# Patient Record
Sex: Female | Born: 1945 | Race: White | Hispanic: No | Marital: Married | Smoking: Never smoker
Health system: Southern US, Community
[De-identification: ages and names within clinical notes are randomized; demographics above are authoritative.]

## PROBLEM LIST (undated history)

## (undated) DIAGNOSIS — Z9289 Personal history of other medical treatment: Secondary | ICD-10-CM

## (undated) DIAGNOSIS — M810 Age-related osteoporosis without current pathological fracture: Secondary | ICD-10-CM

## (undated) DIAGNOSIS — R011 Cardiac murmur, unspecified: Secondary | ICD-10-CM

## (undated) DIAGNOSIS — IMO0002 Reserved for concepts with insufficient information to code with codable children: Secondary | ICD-10-CM

## (undated) DIAGNOSIS — L570 Actinic keratosis: Secondary | ICD-10-CM

## (undated) DIAGNOSIS — K649 Unspecified hemorrhoids: Secondary | ICD-10-CM

## (undated) DIAGNOSIS — M8580 Other specified disorders of bone density and structure, unspecified site: Secondary | ICD-10-CM

## (undated) HISTORY — PX: SKIN CANCER EXCISION: SHX779

## (undated) HISTORY — DX: Actinic keratosis: L57.0

## (undated) HISTORY — DX: Unspecified hemorrhoids: K64.9

## (undated) HISTORY — DX: Cardiac murmur, unspecified: R01.1

## (undated) HISTORY — DX: Personal history of other medical treatment: Z92.89

## (undated) HISTORY — DX: Age-related osteoporosis without current pathological fracture: M81.0

## (undated) HISTORY — PX: COLONOSCOPY: SHX174

## (undated) HISTORY — DX: Reserved for concepts with insufficient information to code with codable children: IMO0002

## (undated) HISTORY — DX: Other specified disorders of bone density and structure, unspecified site: M85.80

---

## 1970-02-03 HISTORY — PX: LIPOMA EXCISION: SHX5283

## 1971-02-04 HISTORY — PX: HAND SURGERY: SHX662

## 2001-05-14 DIAGNOSIS — K649 Unspecified hemorrhoids: Secondary | ICD-10-CM

## 2001-05-14 HISTORY — DX: Unspecified hemorrhoids: K64.9

## 2004-09-04 ENCOUNTER — Ambulatory Visit: Payer: Self-pay

## 2005-10-14 ENCOUNTER — Ambulatory Visit: Payer: Self-pay

## 2006-11-02 ENCOUNTER — Ambulatory Visit: Payer: Self-pay

## 2006-11-03 ENCOUNTER — Ambulatory Visit: Payer: Self-pay

## 2007-11-16 ENCOUNTER — Ambulatory Visit: Payer: Self-pay

## 2008-11-16 ENCOUNTER — Ambulatory Visit: Payer: Self-pay

## 2009-11-20 ENCOUNTER — Ambulatory Visit: Payer: Self-pay

## 2010-02-03 DIAGNOSIS — M810 Age-related osteoporosis without current pathological fracture: Secondary | ICD-10-CM | POA: Insufficient documentation

## 2010-02-03 HISTORY — DX: Age-related osteoporosis without current pathological fracture: M81.0

## 2010-09-04 ENCOUNTER — Emergency Department: Payer: Self-pay

## 2011-01-02 ENCOUNTER — Ambulatory Visit: Payer: Self-pay

## 2012-01-12 ENCOUNTER — Ambulatory Visit: Payer: Self-pay | Admitting: Gastroenterology

## 2012-06-14 ENCOUNTER — Observation Stay: Payer: Self-pay | Admitting: Internal Medicine

## 2012-06-14 LAB — CBC WITH DIFFERENTIAL/PLATELET
Basophil %: 0.4 %
Eosinophil #: 0.1 10*3/uL (ref 0.0–0.7)
Eosinophil %: 1.5 %
HCT: 34.9 % — ABNORMAL LOW (ref 35.0–47.0)
HGB: 12.3 g/dL (ref 12.0–16.0)
Lymphocyte #: 1.4 10*3/uL (ref 1.0–3.6)
MCH: 30.4 pg (ref 26.0–34.0)
Monocyte #: 0.4 x10 3/mm (ref 0.2–0.9)
Neutrophil #: 3.7 10*3/uL (ref 1.4–6.5)
RDW: 12.9 % (ref 11.5–14.5)
WBC: 5.7 10*3/uL (ref 3.6–11.0)

## 2012-06-14 LAB — BASIC METABOLIC PANEL
Chloride: 105 mmol/L (ref 98–107)
Creatinine: 0.98 mg/dL (ref 0.60–1.30)
EGFR (Non-African Amer.): >60
Glucose: 113 mg/dL — ABNORMAL HIGH (ref 65–99)
Osmolality: 279 (ref 275–301)
Potassium: 4.3 mmol/L (ref 3.5–5.1)
Sodium: 138 mmol/L (ref 136–145)

## 2012-06-14 LAB — CK TOTAL AND CKMB (NOT AT ARMC)
CK, Total: 161 U/L (ref 21–215)
CK-MB: 1.5 ng/mL (ref 0.5–3.6)

## 2012-06-14 LAB — TROPONIN I: Troponin-I: <0.02 ng/mL

## 2012-06-15 DIAGNOSIS — I379 Nonrheumatic pulmonary valve disorder, unspecified: Secondary | ICD-10-CM

## 2012-06-15 LAB — CBC WITH DIFFERENTIAL/PLATELET
Basophil %: 0.4 %
Eosinophil #: 0.1 10*3/uL (ref 0.0–0.7)
Lymphocyte #: 1.5 10*3/uL (ref 1.0–3.6)
Lymphocyte %: 41.8 %
Monocyte %: 10.2 %
Neutrophil #: 1.5 10*3/uL (ref 1.4–6.5)
Neutrophil %: 44.2 %

## 2012-06-15 LAB — TROPONIN I: Troponin-I: <0.02 ng/mL

## 2012-06-15 LAB — LIPID PANEL
HDL Cholesterol: 61 mg/dL — ABNORMAL HIGH (ref 40–60)
Ldl Cholesterol, Calc: 108 mg/dL — ABNORMAL HIGH (ref 0–100)
Triglycerides: 50 mg/dL (ref 0–200)
VLDL Cholesterol, Calc: 10 mg/dL (ref 5–40)

## 2012-06-15 LAB — CK TOTAL AND CKMB (NOT AT ARMC): CK, Total: 124 U/L (ref 21–215)

## 2012-11-25 IMAGING — CT CT HEAD WITHOUT CONTRAST
2 series · 16 of 30 positions shown, 20 images · non-contrast
Comparison: none

REASON FOR EXAM: transient ams
COMMENTS:

[Series 2: without · axial · non-contrast · 0.42mm/px · z∈[+376,+506]mm · 13 of 32 slices shown, 17 images]
[im 3/32  brain]
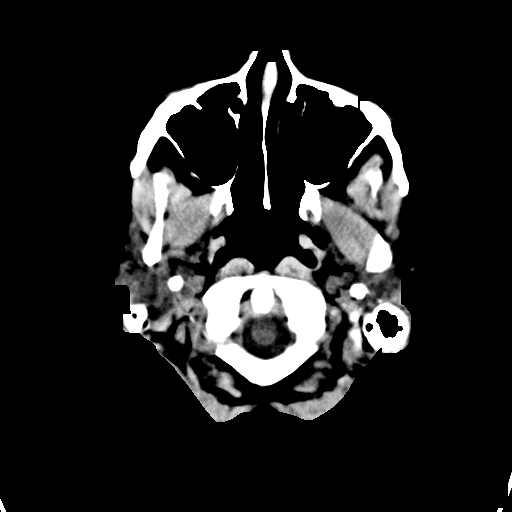
[im 3/32  bone]
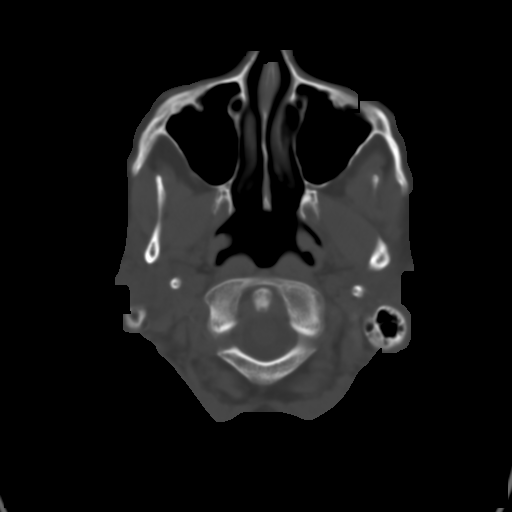
[im 5/32  brain]
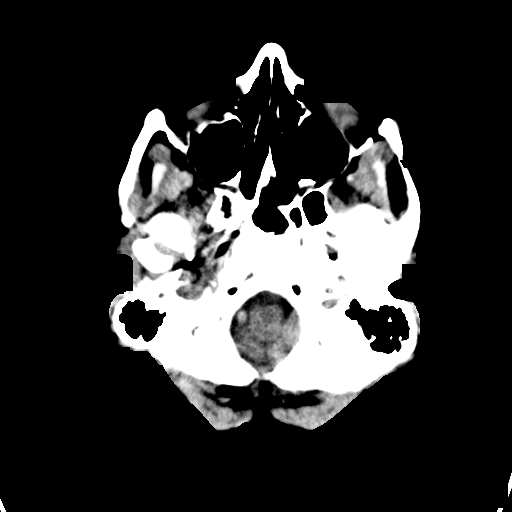
[im 7/32  brain]
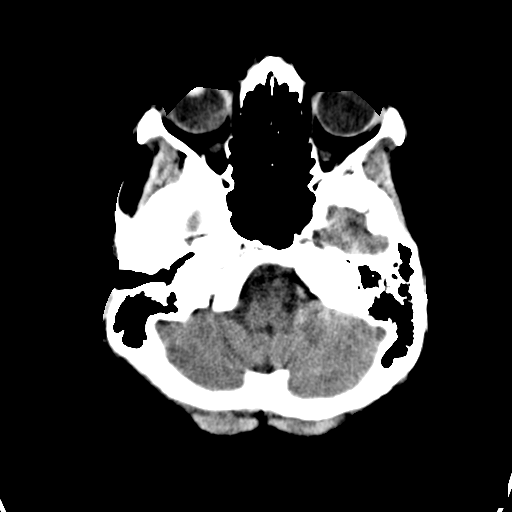
[im 9/32  brain]
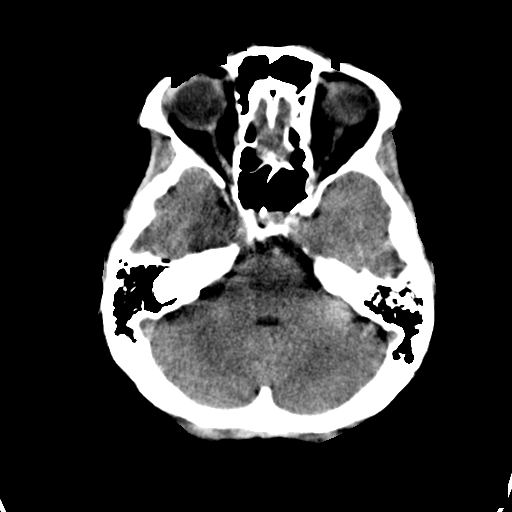
[im 12/32  brain]
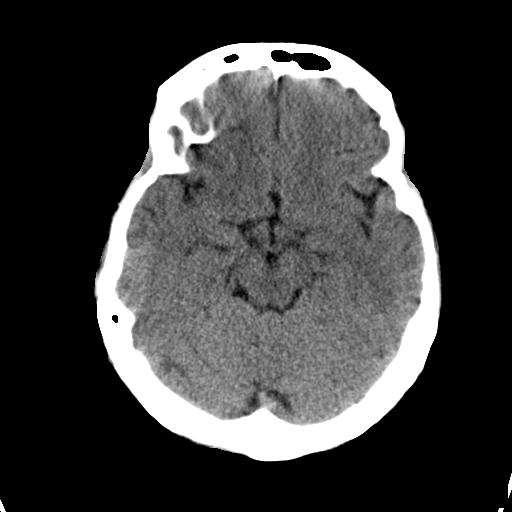
[im 12/32  bone]
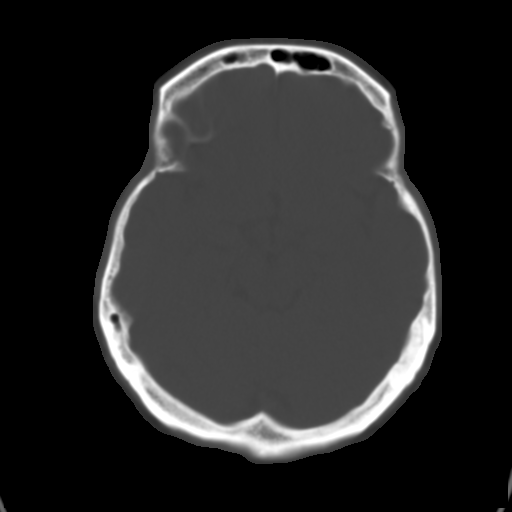
[im 14/32  brain]
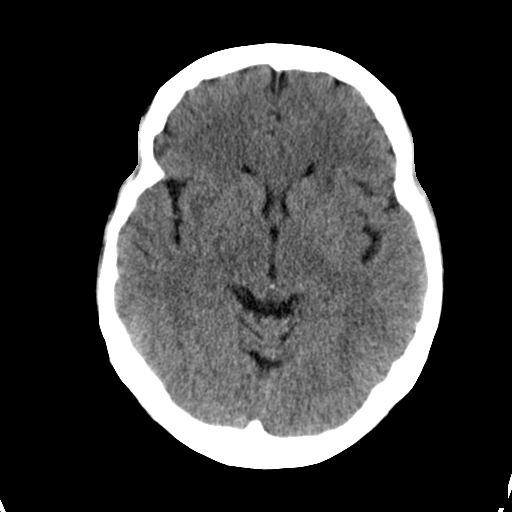
[im 16/32  brain]
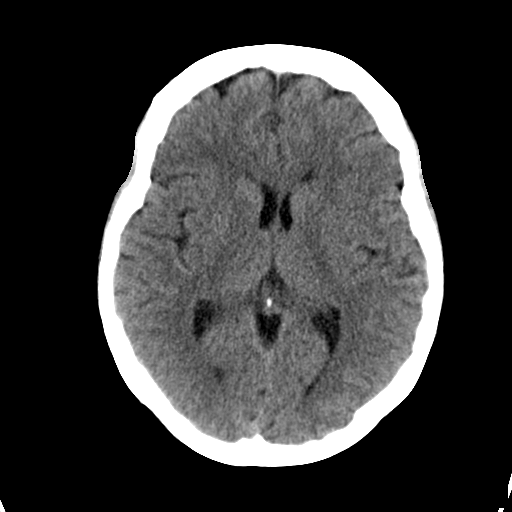
[im 18/32  brain]
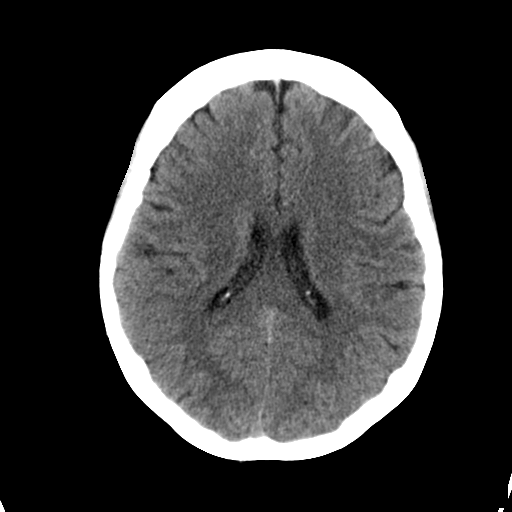
[im 20/32  brain]
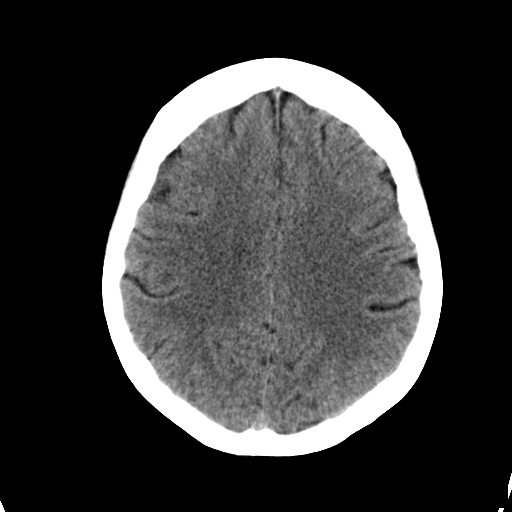
[im 20/32  bone]
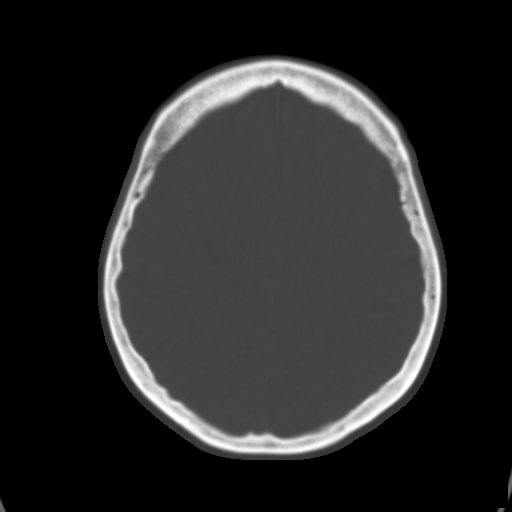
[im 23/32  brain]
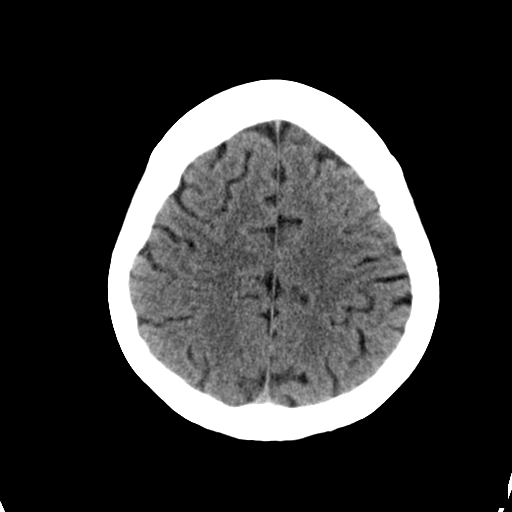
[im 25/32  brain]
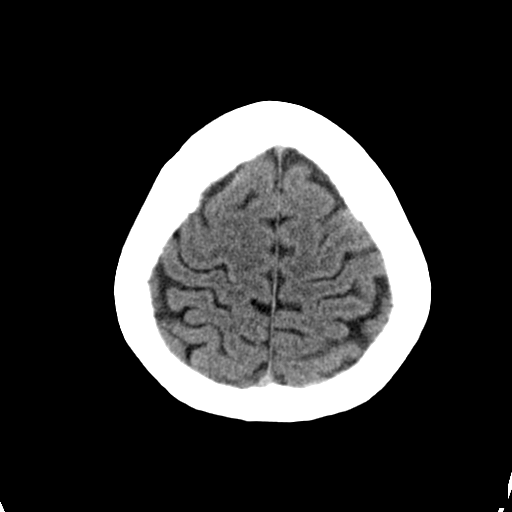
[im 27/32  brain]
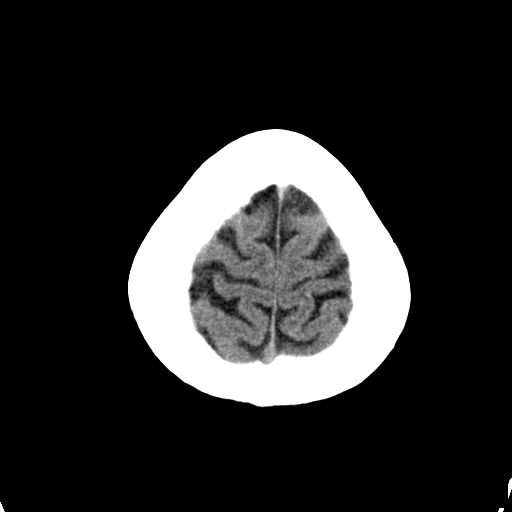
[im 29/32  brain]
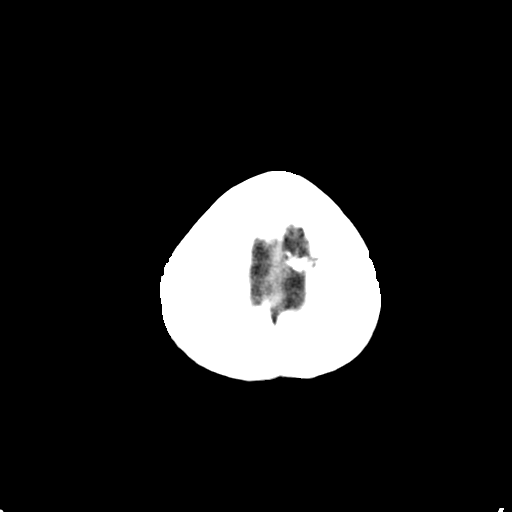
[im 29/32  bone]
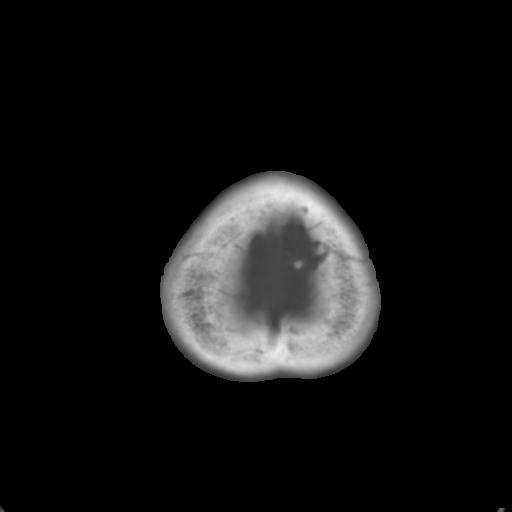

[Series 3: bone · axial · 0.42mm/px · z∈[+376,+420]mm · 3 of 32 slices shown]
[im 3/32  bone]
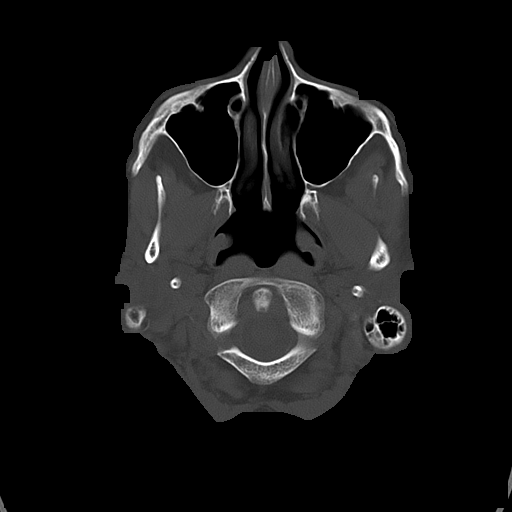
[im 7/32  bone]
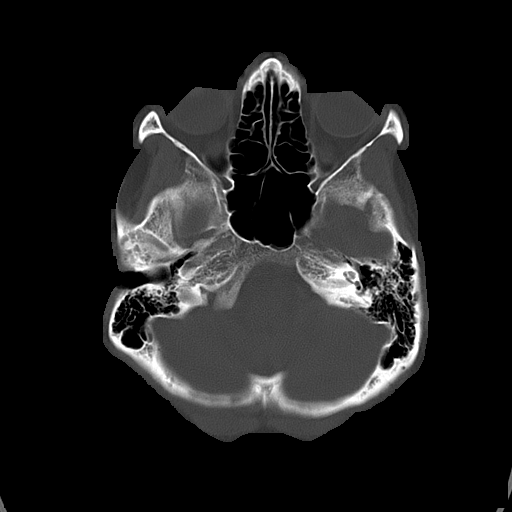
[im 12/32  bone]
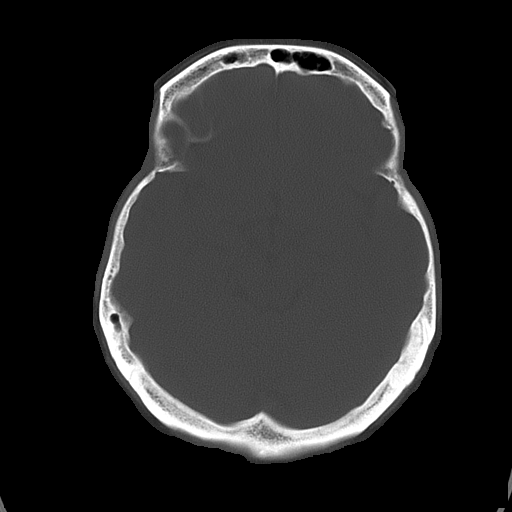

[16 of 30 positions shown; findings below may reference images not displayed]

PROCEDURE:     CT  - CT HEAD WITHOUT CONTRAST  - September 04, 2010  [DATE]

RESULT:     Noncontrast CT of the brain is performed in the standard
fashion. There is no previous exam for comparison.

The ventricles and sulci are normal. There is no hemorrhage. There is no
focal mass, mass-effect or midline shift. There is no evidence of edema or
territorial infarct. The bone windows demonstrate normal aeration of the
paranasal sinuses and mastoid air cells. There is no skull fracture
demonstrated.
IMPRESSION: 1. No acute intracranial abnormality.

## 2014-05-26 NOTE — H&P (Signed)
PATIENT NAME:  Maria Krueger, Maria Krueger MR#:  [id number] DATE OF BIRTH:  1945/12/09  DATE OF ADMISSION:  06/14/2012  PRIMARY CARE PHYSICIAN: Dr. Kary Kos.   CHIEF COMPLAINT: An abnormal sensation on the right jaw.   HISTORY OF PRESENT ILLNESS:  A 69 year old female with no past medical history who was sitting at her computer today when all of a sudden she felt a very unusual feeling on her right jaw and it went to her right arm. It lasted just maybe a minute or so. She said it feels like water pouring down her arm. No other neurological deficits. She got up, did some arm exercises, looked in the mirror, smiled, did some strength exercises and she had no neurological symptoms whatsoever. She said that she felt lightheaded but not dizzy. No chest pain or shortness of breath. Her thought process was very clear. She came to the ER because she was worried. In the ER, this happened just 1 more time.  It just lasted seconds.  It came and it went and this time only just an unusual feeling on the right arm, no tingling and she says it is not really numbness but it could be very mild numbness but she can feel her hand and there is no other abnormal sensation.    REVIEW OF SYSTEMS:   CONSTITUTIONAL: No fever, fatigue, weakness, weight loss, weight gain.  EYES: No blurry or double vision.   ENT:  No ear pain, hearing loss, seasonal allergies, epistaxis.   RESPIRATORY: No coughing, PND, dyspnea, painful respiration.  CARDIOVASCULAR: No chest pain, orthopnea, edema, arrhythmia, dyspnea, palpitations.   GASTROINTESTINAL:  No nausea, vomiting, diarrhea, abdominal pain, melena. Or ulcers.  GENITOURINARY:  No dysuria or hematuria.  ENDOCRINE: No polyuria, polydipsia, thyroid problems, heat or cold intolerance.  HEME AND LYMPH: No anemia, easy bruising or bleeding.  SKIN: No rash or lesion.   MUSCULOSKELETAL:  No pain in shoulders, knees, hips. No limited activity or arthritis. NEURO:  No history of CVA, TIA, seizures,  dementia.  PSYCH: No history of anxiety or depression.    PAST MEDICAL HISTORY:  None.   MEDICATIONS: Calcium 600 plus D 1 tablet t.i.d.    ALLERGIES: AMPICILLIN.   PAST SURGICAL HISTORY: None.   SOCIAL HISTORY: No tobacco, alcohol or drug use.   FAMILY HISTORY: Positive for lung cancer and her mother had a CVA and liver damage secondary to Vioxx.   PHYSICAL EXAMINATION: VITAL SIGNS: Temperature 98.1, pulse is 70, respirations 18, blood pressure 119/62 and 98% on room air.  GENERAL: The patient is alert, oriented, not in acute distress, well-developed, well-nourished.  HEENT: Head is atraumatic. Pupils are round. Sclerae anicteric. Mucous membranes are moist. Oropharynx is clear.  NECK: Supple without JVD, carotid bruit or enlarged thyroid. Full range of motion without pain.  CARDIOVASCULAR: Regular rate and rhythm. No murmurs, gallops or rubs. Cardiac palpation within normal limits. Femoral and pedal pulses are palpable. No peripheral edema.  LUNGS: Clear to auscultation without crackles, rales, rhonchi or wheezing. No dullness to percussion. Symmetrical expansion.  GASTROINTESTINAL: Bowel sounds positive. Nontender, nondistended. No  hepatosplenomegaly. No hernia.  No rebound, guarding or rigidity.  EXTREMITIES: No clubbing, cyanosis or edema.  NEUROLOGICAL EXAMINATION: Cranial nerves II through XII are intact. There are no focal deficits. DTRs are symmetric bilaterally. Motor strength is 4 out of 4. Cerebellar exam is completely negative.   LABORATORY, DIAGNOSTIC AND RADIOLOGIC DATA:  1.  White blood cells 5.7, hemoglobin 12.3, hematocrit 35, platelets 137. Sodium 138,  potassium 4.3, chloride 105, bicarb 29, BUN 21, creatinine 0.98, glucose 113, calcium is 9.3.  2.  CT of the head showed no acute intracranial hemorrhage or CVA.  3.  EKG normal sinus rhythm, no ST elevation or depression.   ASSESSMENT AND PLAN: This is a 69 year old female who presents with vague symptoms of right  jaw sensation and right arm sensory issues.  1.  Sensation issues. She says that she felt like something pouring down her right arm. She has no other neurological symptoms associated with it, no chest pain.  This is unlikely a transient ischemic attack, however, since it happened again in the ER, I think it is probably wise to make sure to completely rule out a transient ischemic attack or stroke. Her symptoms do not sound like a neurological event is happening. I will order an MRI. If the MRI is negative, the patient may be discharged without an echo or carotid Dopplers. If the MRI is positive, then we will need to order an echo and carotid Dopplers.  I will also monitor her on telemetry to rule out any acute coronary syndrome with troponins as the cause of these symptoms. We will do neuro checks q.4 hours and I have started aspirin, however, if the symptoms do not recur again, again the likelihood of a transient ischemic attack or neurological event is less likely so she would not need to be discharged with aspirin.  2.  Thrombocytopenia, mild. Will repeat a platelet count in the morning. The patient is ambulatory so I will not write for some Lovenox.  3.  The patient is full CODE STATUS.   TIME SPENT:  Approximately 40 minutes.     ____________________________ Donell Beers. Benjie Karvonen, MD spm:cs D: 06/14/2012 19:04:00 ET T: 06/14/2012 20:10:11 ET JOB#: [id number]  cc: Shalayah Beagley P. Benjie Karvonen, MD, <Dictator> Irven Easterly. Kary Kos, MD Donell Beers Loys Shugars MD ELECTRONICALLY SIGNED 06/14/2012 21:26

## 2014-05-26 NOTE — Discharge Summary (Signed)
PATIENT NAME:  Maria Krueger, OLSEN MR#:  [id number] DATE OF BIRTH:  25-Aug-1945  DATE OF ADMISSION:  06/14/2012 DATE OF DISCHARGE:  06/15/2012  DISCHARGE DIAGNOSIS:  Abnormal sensation in the right jaw/arm for a few seconds.  No transient ischemic attack/cerebrovascular accident and has been ruled out.  Negative MRI of the brain, carotid Dopplers.  This could be due to nerve pinching in the neck.  Negative orthostatic vitals.   SECONDARY DIAGNOSIS:  None.   CONSULTATIONS:  None.   PROCEDURES AND RADIOLOGY:   1.  CT scan of the head without contrast on May 12 showed no acute intracranial abnormalities.  2.  MRI of the brain without contrast showed intermediate hyperintensity on the FLAIR images, likely incidental area, could be due to sequela of migraine headache.  3.  Bilateral carotid Dopplers on May 13 showed no significant atherosclerotic disease.  No evidence of hemodynamically significant stenosis.  4.  A 2-D echocardiogram on May 13 showed normal LV systolic function with ejection fraction of 55% to 60%.  Mild to moderate pulmonary valve regurgitation.  Mild LVOT gradient which does not significantly worsen with Valsalva.   HISTORY AND SHORT HOSPITAL COURSE:  The patient is a 69 year old healthy female, was admitted for abnormal sensation in the right jaw/arm which was lasting seconds.  Please see Dr. Gardiner Coins dictated history and physical for further details.  There was some concern for TIA,  although she had a completely negative neurological work-up.  Her symptoms were only lasting a few seconds.  This was thought not to be due to TIA and possible nerve pinching as she has had a history of some nerve pinching in the lower back.  The patient was feeling much better and was discharged home in stable condition.   PHYSICAL EXAMINATION: VITAL SIGNS:  On the date of discharge her vital signs were as follows:  Temperature 98.8, heart rate 77 per minute, respirations 16 per minute, blood pressure 115/72  mmHg.  She was saturating 100% on room air.  Pertinent physical examination on the date of discharge:  CARDIOVASCULAR:  S1, S2 normal.  No murmurs, rubs or gallop.  LUNGS:  Clear to auscultation bilaterally.  No wheezing, rales, rhonchi or crepitation.  ABDOMEN:  Soft, benign.  NEUROLOGIC:  Nonfocal examination.  All other physical examination at baseline.   DISCHARGE MEDICATIONS: 1.  Calcium with vitamin D 1 tablet by mouth 3 times a day.  2.  Aspirin 81 mg by mouth daily.   DISCHARGE DIET:  Low fat, low cholesterol.   DISCHARGE ACTIVITY:  As tolerated.   DISCHARGE INSTRUCTIONS AND FOLLOW-UP:  The patient was instructed to follow up with her primary care physician, Dr. Jarome Lamas, in 1 to 2 weeks.   Total time spent on this patient was 45 minutes.    ____________________________ [name]. Manuella Ghazi, MD vss:ea D: 06/19/2012 09:32:14 ET T: 06/20/2012 01:39:55 ET JOB#: [id number]  cc: Elfa Wooton S. Manuella Ghazi, MD, <Dictator> Irven Easterly. Kary Kos, [place] MD ELECTRONICALLY SIGNED 06/21/2012 8:56

## 2015-02-04 DIAGNOSIS — C4491 Basal cell carcinoma of skin, unspecified: Secondary | ICD-10-CM

## 2015-02-04 HISTORY — DX: Basal cell carcinoma of skin, unspecified: C44.91

## 2015-03-23 ENCOUNTER — Encounter: Payer: Self-pay | Admitting: Family Medicine

## 2015-04-10 ENCOUNTER — Encounter: Payer: Self-pay | Admitting: Urology

## 2015-04-10 ENCOUNTER — Ambulatory Visit (INDEPENDENT_AMBULATORY_CARE_PROVIDER_SITE_OTHER): Payer: Medicare Other | Admitting: Urology

## 2015-04-10 VITALS — BP 124/84 | HR 99 | Ht 64.0 in | Wt 117.0 lb

## 2015-04-10 DIAGNOSIS — N362 Urethral caruncle: Secondary | ICD-10-CM | POA: Diagnosis not present

## 2015-04-10 DIAGNOSIS — R899 Unspecified abnormal finding in specimens from other organs, systems and tissues: Secondary | ICD-10-CM | POA: Diagnosis not present

## 2015-04-10 DIAGNOSIS — N952 Postmenopausal atrophic vaginitis: Secondary | ICD-10-CM

## 2015-04-10 NOTE — Progress Notes (Signed)
04/10/2015 1:42 PM   Raye Sorrow May 31, 1945 [id number]  Referring provider: No referring provider defined for this encounter.  Chief Complaint  Patient presents with  . Hematuria    New Patient    HPI: 70 yo F referred by Dr. Chrystine Oiler for further work up of light pinkish discharge on a pad  por the past year.  She reports that she received samples of a panty liner in the mail a little over a year ago and started using them. Since that time, she is noted a very faint pinkish discharge on her panty liner that turns brown when it dries. She is concerned that this is blood.    She brings a dry pad today as an exam.  This demonstrates scant brown tinged discharge.    She denies any stress incontience.  Occational rare urge incontinence.  No gross hematuria and very rare UTIs.  UA on 03/22/2014 a year ago did show evidence of UTI and ultimately grew Escherichia coli. She was treated adequately for this. Follow-up UAs have been negative for any blood including today.  Never smoker.    Rarely sexually active.  No symptoms consistent with pelvic organ prolapse.    She is menopausal.  She does still have her uterus. No obvious vaginal bleeding.  No vaginal symptoms .    PMH: Past Medical History  Diagnosis Date  . Squamous cell carcinoma ([place])   . Heart murmur     Surgical History: Past Surgical History  Procedure Laterality Date  . Skin cancer excision  2008    Home Medications:    Medication List       This list is accurate as of: 04/10/15 11:59 PM.  Always use your most recent med list.               CALCIUM 1200 PO  Take by mouth.     VITAMIN D-1000 MAX ST 1000 units tablet  Generic drug:  Cholecalciferol  Take by mouth.        Allergies:  Allergies  Allergen Reactions  . Ampicillin Rash    Family History: Family History  Problem Relation Age of Onset  . Bladder Cancer Neg Hx   . Kidney cancer Neg Hx   . Prostate cancer Neg Hx     Social  History:  reports that she has never smoked. She does not have any smokeless tobacco history on file. She reports that she does not drink alcohol or use illicit drugs.  ROS: UROLOGY Frequent Urination?: No Hard to postpone urination?: No Burning/pain with urination?: No Get up at night to urinate?: No Leakage of urine?: No Urine stream starts and stops?: No Trouble starting stream?: No Do you have to strain to urinate?: No Blood in urine?: No Urinary tract infection?: No Sexually transmitted disease?: No Injury to kidneys or bladder?: No Painful intercourse?: No Weak stream?: No Currently pregnant?: No Vaginal bleeding?: No Last menstrual period?: n  Gastrointestinal Nausea?: No Vomiting?: No Indigestion/heartburn?: No Diarrhea?: No Constipation?: No  Constitutional Fever: No Night sweats?: No Weight loss?: No Fatigue?: No  Skin Skin rash/lesions?: No Itching?: No  Eyes Blurred vision?: No Double vision?: No  Ears/Nose/Throat Sore throat?: No Sinus problems?: No  Hematologic/Lymphatic Swollen glands?: No Easy bruising?: Yes  Cardiovascular Leg swelling?: No Chest pain?: No  Respiratory Cough?: No Shortness of breath?: No  Endocrine Excessive thirst?: No  Musculoskeletal Back pain?: No Joint pain?: No  Neurological Headaches?: No Dizziness?: No  Psychologic Depression?: No  Anxiety?: No  Physical Exam: BP 124/84 mmHg  Pulse 99  Ht 5\' 4"  (1.626 m)  Wt 117 lb (53.071 kg)  BMI 20.07 kg/m2  Constitutional:  Alert and oriented, No acute distress. HEENT: [place] AT, moist mucus membranes.  Trachea midline, no masses. Cardiovascular: No clubbing, cyanosis, or edema. Respiratory: Normal respiratory effort, no increased work of breathing. GI: Abdomen is soft, nontender, nondistended, no abdominal masses GU: No CVA tenderness.  Pelvic exam:  Normal external genitalia. Urethral meatus with notable urethral caruncle at the 6:00 position, mildly  inflamed without active bleeding. Significant atrophic vaginitis with atrophic vaginal mucosa noted. No significant cystocele or rectocele with Valsalva. Skin: No rashes, bruises or suspicious lesions. Lymph: No cervical or inguinal adenopathy. Neurologic: Grossly intact, no focal deficits, moving all 4 extremities. Psychiatric: Normal mood and affect.  Laboratory Data: Lab Results  Component Value Date   WBC 3.5* 06/15/2012   HGB 12.0 06/15/2012   HCT 34.4* 06/15/2012   MCV 87 06/15/2012   PLT 121* 06/15/2012    Lab Results  Component Value Date   CREATININE 0.98 06/14/2012    Urine Culture, Routine - [facility] (03/22/2014 4:49 PM) Urine Culture, Routine - [facility] (03/22/2014 4:49 PM)  Component Value Ref Range  Urine Culture, Routine - [facility] Final report (A)   Result 1 - [facility] Escherichia coli (A)Comment: 50,000-100,000 colony forming units per mL   Antimicrobial Susceptibility - [facility] Comment Comment:  ** S = Susceptible; I = Intermediate; R = Resistant **  P = Positive; N = Negative MICS are expressed in micrograms per mL  Antibiotic RSLT#1RSLT#2RSLT#3RSLT#4 Amoxicillin/Clavulanic AcidS Ampicillin S Cefepime S CeftriaxoneS Cefuroxime S CephalothinS CiprofloxacinS ErtapenemS Gentamicin S Imipenem S Levofloxacin S Nitrofurantoin S Piperacillin S Tetracycline S Tobramycin S Trimethoprim/Sulfa R     Urinalysis Urinalysis w/Microscopic (03/22/2015 2:02 PM) Urinalysis w/Microscopic (03/22/2015 2:02 PM)  Component Value Ref Range  Color Yellow Yellow, Straw  Clarity Clear Clear  Specific Gravity 1.015 1.000 -  1.030  pH, Urine 7.0 5.0 - 8.0  Protein, Urinalysis Negative Negative, Trace mg/dL  Glucose, Urinalysis Negative Negative mg/dL  Ketones, Urinalysis Negative Negative mg/dL  Blood, Urinalysis Negative Negative  Nitrite, Urinalysis Negative Negative  Leukocyte Esterase, Urinalysis Trace (A) Negative  White Blood Cells, Urinalysis 0-3 None Seen, 0-3 /hpf  Red Blood Cells, Urinalysis None Seen None Seen, 0-3 /hpf  Bacteria, Urinalysis Few (A) None Seen /hpf  Squamous Epithelial Cells, Urinalysis Rare Rare, Few, None Seen /hpf   Results for orders placed or performed in visit on 04/10/15  Microscopic Examination  Result Value Ref Range   WBC, UA None seen 0 -  5 /hpf   RBC, UA None seen 0 -  2 /hpf   Epithelial Cells (non renal) None seen 0 - 10 /hpf   Bacteria, UA None seen None seen/Few  Urinalysis, Complete  Result Value Ref Range   Specific Gravity, UA 1.010 1.005 - 1.030   pH, UA 5.5 5.0 - 7.5   Color, UA Yellow Yellow   Appearance Ur Clear Clear   Leukocytes, UA Negative Negative   Protein, UA Negative Negative/Trace   Glucose, UA Negative Negative   Ketones, UA Negative Negative   RBC, UA Negative Negative   Bilirubin, UA Negative Negative   Urobilinogen, Ur 0.2 0.2 - 1.0 mg/dL   Nitrite, UA Negative Negative   Microscopic Examination See below:     Assessment & Plan:    1. Nonspecific abnormal finding in body substances Pink/brown tinged discharge on panty  liner. Review of urinalysis on several occasions revealed no evidence of microscopic hematuria. On exam today, she was found to have significant atrophic vaginitis (asymptomatic) as well as a small urethral caruncle.   Each of these may lead to some mild blood-tinged discharge which are likely the cause of her symptoms. No other concerning findings on exam today. I suspect the benign pathology.  Reviewed warning symptoms including gross hematuria or significant vaginal bleeding. - Urinalysis, Complete  2. Urethral  caruncle Asymptomatic, therefore no treatment recommended Did discuss the possibility of using local estrogen cream in future if becomes symptomatic  3. Atrophic vaginitis As above  Return if symptoms worsen or fail to improve.    Hollice Espy, MD  [facility] [address], [place], [place] [address] [phone]

## 2015-04-11 LAB — MICROSCOPIC EXAMINATION
Bacteria, UA: NONE SEEN
EPITHELIAL CELLS (NON RENAL): NONE SEEN /HPF (ref 0–10)
RBC MICROSCOPIC, UA: NONE SEEN /HPF (ref 0–?)
WBC UA: NONE SEEN /HPF (ref 0–?)

## 2015-04-11 LAB — URINALYSIS, COMPLETE
BILIRUBIN UA: NEGATIVE
GLUCOSE, UA: NEGATIVE
KETONES UA: NEGATIVE
LEUKOCYTES UA: NEGATIVE
Nitrite, UA: NEGATIVE
PROTEIN UA: NEGATIVE
RBC, UA: NEGATIVE
Specific Gravity, UA: 1.01 (ref 1.005–1.030)
Urobilinogen, Ur: 0.2 mg/dL (ref 0.2–1.0)
pH, UA: 5.5 (ref 5.0–7.5)

## 2016-01-22 ENCOUNTER — Other Ambulatory Visit: Payer: Self-pay | Admitting: Certified Nurse Midwife

## 2016-01-22 DIAGNOSIS — Z1231 Encounter for screening mammogram for malignant neoplasm of breast: Secondary | ICD-10-CM

## 2016-02-25 ENCOUNTER — Ambulatory Visit
Admission: RE | Admit: 2016-02-25 | Discharge: 2016-02-25 | Disposition: A | Discharge status: Home / Self Care | Payer: Medicare Other | Source: Ambulatory Visit | Attending: Certified Nurse Midwife | Admitting: Certified Nurse Midwife

## 2016-02-25 DIAGNOSIS — Z1231 Encounter for screening mammogram for malignant neoplasm of breast: Secondary | ICD-10-CM | POA: Insufficient documentation

## 2016-06-02 DIAGNOSIS — D239 Other benign neoplasm of skin, unspecified: Secondary | ICD-10-CM

## 2016-06-02 HISTORY — DX: Other benign neoplasm of skin, unspecified: D23.9

## 2016-11-27 ENCOUNTER — Other Ambulatory Visit: Payer: Self-pay | Admitting: Certified Nurse Midwife

## 2016-11-27 ENCOUNTER — Telehealth: Payer: Self-pay

## 2016-11-27 DIAGNOSIS — Z8739 Personal history of other diseases of the musculoskeletal system and connective tissue: Secondary | ICD-10-CM

## 2016-11-27 DIAGNOSIS — M85852 Other specified disorders of bone density and structure, left thigh: Secondary | ICD-10-CM

## 2016-11-27 NOTE — Telephone Encounter (Signed)
I put the referral in Nancy-it is due after 12/12/2016

## 2016-11-27 NOTE — Telephone Encounter (Signed)
I spoke to the patient. She would like to have the DEXA at [facility] again - info in [place]. I will contact [facility] to see if their referral process is the same.

## 2016-11-27 NOTE — Telephone Encounter (Signed)
Pt called triage line stating she knows this is her off year for her annual but it is time for her Bone density scan. She usually goes to [facility] in [place]. Please call her back when the referral is in. CB# [phone]

## 2016-11-28 NOTE — Telephone Encounter (Signed)
Confirmed with [facility] the fax# and that they will contact the patient directly.Referral faxed. Lmtrc.

## 2016-11-28 NOTE — Telephone Encounter (Signed)
Patient returned call and is aware [facility] will contact her directly to schedule an appointment. Patient was given my ext and is aware she is welcome to call back if she doesn't hear from [facility] in a week or so, that I am happy to f/u if needed.

## 2017-01-19 ENCOUNTER — Other Ambulatory Visit: Payer: Self-pay | Admitting: Family Medicine

## 2017-01-19 ENCOUNTER — Other Ambulatory Visit: Payer: Self-pay | Admitting: Certified Nurse Midwife

## 2017-01-19 DIAGNOSIS — Z1231 Encounter for screening mammogram for malignant neoplasm of breast: Secondary | ICD-10-CM

## 2017-03-03 ENCOUNTER — Ambulatory Visit
Admission: RE | Admit: 2017-03-03 | Discharge: 2017-03-03 | Disposition: A | Discharge status: Home / Self Care | Payer: Medicare HMO | Source: Ambulatory Visit | Attending: Family Medicine | Admitting: Family Medicine

## 2017-03-03 DIAGNOSIS — Z1231 Encounter for screening mammogram for malignant neoplasm of breast: Secondary | ICD-10-CM | POA: Diagnosis present

## 2017-09-27 NOTE — Progress Notes (Signed)
Gynecology Annual Exam  PCP: Maryland Pink, MD  Chief Complaint:  Chief Complaint  Patient presents with  . Gynecologic Exam    continuing to d/c; wt loss;     History of Present Illness:Maria Krueger presents today for her annual exam . She is a 72 year old postmenopausal Caucasian/White female , G 3 P 2 0 1 2 . She is having no significant GYN problems. Her menses are absent and she is postmenopausal. She does not have hot flashes and night sweats. She had seen Dr Erlene Quan in Urology in 2017 for some pinkish discharge she was having  And was diagnosed with a urethral caruncle. She reports that she is still having some brown spotting from this area intermittently. Has not resumed Vagifem after her 2017 visit.   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 Since her last annual GYN exam dated 09/26/2015, she has had no significant change in her health.  She is currently sexually active, and would like a RX of Vagifem in case she needs it. Has used this in the past for dyspareunia with relief. Her most recent pap smear was obtained 10/17/2013 and was normal.  Her most recent mammogram obtained on 1/ 03/03/2016 was normal. There is no family history of breast cancer. There is no family history of ovarian cancer. The patient does do monthly self breast exams.  She had a colonoscopy in 2013 that was normal. Her next colonoscopy is due in 10 years [name])  She had a recent DEXA scan obtained 12/17/2016 in that showed a significant improvement in density compared with her previous Dexa in 2016. Her T score of the spine increased from -1.8 to -1.5 and the T score of the hip increased from -2.2 to -1.8.  She has been on Boniva in the past for osteopenia, but since her 2016 DEXA has only been taking calcium supplement called Bone Up which contains 1000 mgm calcium, 100IU vitamin D3, and magnesium as well as   extra vitamin D2 liquid, vitamin K and strontium..  The patient does not smoke.  The patient does not drink alcohol.  The patient does not use illegal drugs.  The patient exercises regularly, 3-4 times a week and also does yoga. She has lost 2# in the last 2 years, probably from increased exercise and activity. The patient does get adequate calcium in her diet and with her supplements  She had a recent cholesterol screen in 2017 by her PCP DR Kary Kos that was normal.   Review of Systems: Review of Systems  Constitutional: Positive for weight loss (of 2 # in 2 years.). Negative for chills and fever.  HENT: Negative for congestion, sinus pain and sore throat.   Eyes: Negative for blurred vision and pain.  Respiratory: Negative for hemoptysis, shortness of breath and wheezing.   Cardiovascular: Negative for chest pain, palpitations and leg swelling.  Gastrointestinal: Negative for abdominal pain, blood in stool, diarrhea, heartburn, nausea and vomiting.  Genitourinary: Negative for dysuria, frequency, hematuria and urgency.       Spotting from caruncle  Musculoskeletal: Negative for back pain, joint pain and myalgias.  Skin: Negative for itching and rash.  Neurological: Negative for dizziness, tingling and headaches.  Endo/Heme/Allergies: Negative for environmental allergies and polydipsia. Does not bruise/bleed easily.       Negative for hirsutism   Psychiatric/Behavioral: Negative for depression. The patient is not nervous/anxious  and does not have insomnia.     Past Medical History:  Past Medical History:  Diagnosis Date  . Heart murmur   . Hemorrhoids 05/14/2001   internal non-bleeding (pre conoloscopy)  . History of mammogram 01/06/2013; 02/25/16   birads 2; neg  . Osteopenia 11/14; 11/16   left femoral neck and hip and spine; FRAX hip = 3.7% 11/16  . Osteoporosis 2012   lumbar spine  . Squamous cell carcinoma     Past Surgical History:  Past Surgical History:  Procedure  Laterality Date  . COLONOSCOPY  05/14/2001/ 2013   non bleeding internal hemorrhoids  . HAND SURGERY Left 1973   tendon tumor left pointer finger  . LIPOMA EXCISION  1972   waist  . SKIN CANCER EXCISION  2006; 2009   nose-basal cell ca; right ring finger - sq. cell ca    Family History:  Family History  Problem Relation Age of Onset  . Stroke Mother        d/t viox  . Diabetes Mother        type 2  . Heart attack Father   . Cancer Father 25       lung  . Bladder Cancer Neg Hx   . Kidney cancer Neg Hx   . Prostate cancer Neg Hx   . Breast cancer Neg Hx   . Ovarian cancer Neg Hx     Social History:  Social History   Socioeconomic History  . Marital status: Married    Spouse name: Not on file  . Number of children: 2  . Years of education: 54  . Highest education level: Not on file  Occupational History  . Occupation: RETIRED  Social Needs  . Financial resource strain: Not on file  . Food insecurity:    Worry: Not on file    Inability: Not on file  . Transportation needs:    Medical: Not on file    Non-medical: Not on file  Tobacco Use  . Smoking status: Never Smoker  . Smokeless tobacco: Never Used  Substance and Sexual Activity  . Alcohol use: No    Alcohol/week: 0.0 standard drinks  . Drug use: No  . Sexual activity: Yes    Partners: Male    Birth control/protection: Post-menopausal  Lifestyle  . Physical activity:    Days per week: Not on file    Minutes per session: Not on file  . Stress: Not on file  Relationships  . Social connections:    Talks on phone: Not on file    Gets together: Not on file    Attends religious service: Not on file    Active member of club or organization: Not on file    Attends meetings of clubs or organizations: Not on file    Relationship status: Not on file  . Intimate partner violence:    Fear of current or ex partner: Not on file    Emotionally abused: Not on file    Physically abused: Not on file    Forced  sexual activity: Not on file  Other Topics Concern  . Not on file  Social History Narrative  . Not on file    Allergies:  Allergies  Allergen Reactions  . Ampicillin Rash    Medications: Bone up-one capsule BID Liquid Vitamin D3 Strontium citrate 340 mag-take 2 daily Vitamin K2 Elderberry fruit 550 mgm daily    Physical Exam Vitals: BP (!) 110/50   Pulse 78  Ht 5' 4.5" (1.638 m)   Wt 113 lb 12 oz (51.6 kg)   BMI 19.22 kg/m   General: pleasant WF in NAD HEENT: normocephalic, anicteric Neck: no thyroid enlargement, no palpable nodules, no cervical lymphadenopathy  Pulmonary: No increased work of breathing, CTAB Cardiovascular: RRR, without murmur  Breast: Breast symmetrical, no tenderness, no palpable nodules or masses, no skin or nipple retraction present, no nipple discharge.  No axillary, infraclavicular or supraclavicular lymphadenopathy. Abdomen: Soft, non-tender, non-distended.  Umbilicus without lesions.  No hepatomegaly or masses palpable. No evidence of hernia. Genitourinary:  External: Atrophic changes, small caruncle posteriorly that appears red  Vagina: flattened rugae, no evidence of prolapse.    Cervix: Grossly normal in appearance, no bleeding, non-tender  Uterus: Anteverted, small, mobile, and non-tender  Adnexa: No adnexal masses, non-tender  Rectal: deferred  Lymphatic: no evidence of inguinal lymphadenopathy Extremities: no edema, erythema, or tenderness Neurologic: Grossly intact Psychiatric: mood appropriate, affect full     Assessment: 72 y.o. [id number] well woman postmenopausal exam Caruncle-symptomatic with spotting  Plan:   1) Breast cancer screening - recommend monthly self breast exam and annual screening mammograms. Mammogram due after 03/03/2018  2) Colon cancer screening-colonoscopy due in 2023.  3) Cervical cancer screening - Pap was done. ASCCP guidelines and rational discussed.  Patient opts for every 3-5 year  screening  interval  4) Bone density- recent DEXA showing increased bone density. Continue present plan of vitamin supplement and exercise. Repeat Dexa in 12/2018  5) Routine healthcare maintenance including cholesterol and diabetes screening managed by PCP   6) Discussed using topical estrogen cream at urethra for treatment of caruncle. Follow up in 2-3 months for persistent spotting of area-consider referral back to urology.  7) RX Vagifem if desires to use  8) RTO in 2 years and prn.  Dalia Heading, CNM

## 2017-09-28 ENCOUNTER — Other Ambulatory Visit (HOSPITAL_COMMUNITY)
Admission: RE | Admit: 2017-09-28 | Discharge: 2017-09-28 | Disposition: A | Discharge status: Home / Self Care | Payer: Medicare HMO | Source: Ambulatory Visit | Attending: Obstetrics and Gynecology | Admitting: Obstetrics and Gynecology

## 2017-09-28 ENCOUNTER — Encounter: Payer: Self-pay | Admitting: Certified Nurse Midwife

## 2017-09-28 ENCOUNTER — Ambulatory Visit (INDEPENDENT_AMBULATORY_CARE_PROVIDER_SITE_OTHER): Payer: Medicare HMO | Admitting: Certified Nurse Midwife

## 2017-09-28 VITALS — BP 110/50 | HR 78 | Ht 64.5 in | Wt 113.8 lb

## 2017-09-28 DIAGNOSIS — IMO0002 Reserved for concepts with insufficient information to code with codable children: Secondary | ICD-10-CM | POA: Insufficient documentation

## 2017-09-28 DIAGNOSIS — Z01419 Encounter for gynecological examination (general) (routine) without abnormal findings: Secondary | ICD-10-CM

## 2017-09-28 DIAGNOSIS — Z01411 Encounter for gynecological examination (general) (routine) with abnormal findings: Secondary | ICD-10-CM

## 2017-09-28 DIAGNOSIS — M8580 Other specified disorders of bone density and structure, unspecified site: Secondary | ICD-10-CM | POA: Insufficient documentation

## 2017-09-28 DIAGNOSIS — N362 Urethral caruncle: Secondary | ICD-10-CM | POA: Diagnosis not present

## 2017-09-28 DIAGNOSIS — M8589 Other specified disorders of bone density and structure, multiple sites: Secondary | ICD-10-CM

## 2017-09-28 DIAGNOSIS — Z124 Encounter for screening for malignant neoplasm of cervix: Secondary | ICD-10-CM | POA: Diagnosis present

## 2017-09-28 MED ORDER — ESTRADIOL 10 MCG VA TABS: 1.0000 | ORAL_TABLET | VAGINAL | 11 refills | Status: AC

## 2017-10-02 LAB — CYTOLOGY - PAP: DIAGNOSIS: NEGATIVE

## 2017-12-29 ENCOUNTER — Telehealth: Payer: Self-pay | Admitting: Certified Nurse Midwife

## 2017-12-29 NOTE — Telephone Encounter (Signed)
Patient was seen in August, had caruncle and given cream to try.  She was told to let [place] know after a few months if it helped, she states it is still present.  Please advise.

## 2017-12-30 NOTE — Telephone Encounter (Signed)
Pt's [facility]sb called after hour nurse stating that his wife was trying to see why the office called.  [phone]

## 2018-01-06 ENCOUNTER — Telehealth: Payer: Self-pay | Admitting: Certified Nurse Midwife

## 2018-01-06 NOTE — Telephone Encounter (Signed)
Returning patient call regarding caruncle. Estrogen cream topically initially worked to decrease spotting. Went to 2x/week and the symptoms returned-spotting and feeling irritated. Options explained including increasing cream to daily again or making appointment to see urologist back to discuss excision. Patient will make appointment to see urologist. Dalia Heading, CNM

## 2018-01-06 NOTE — Telephone Encounter (Signed)
Patient called.

## 2018-02-08 ENCOUNTER — Other Ambulatory Visit: Payer: Self-pay | Admitting: Family Medicine

## 2018-02-08 DIAGNOSIS — Z1231 Encounter for screening mammogram for malignant neoplasm of breast: Secondary | ICD-10-CM

## 2018-03-03 ENCOUNTER — Ambulatory Visit (INDEPENDENT_AMBULATORY_CARE_PROVIDER_SITE_OTHER): Payer: Medicare HMO | Admitting: Urology

## 2018-03-03 ENCOUNTER — Encounter: Payer: Self-pay | Admitting: Urology

## 2018-03-03 ENCOUNTER — Encounter

## 2018-03-03 VITALS — BP 131/80 | HR 83 | Ht 64.5 in | Wt 115.6 lb

## 2018-03-03 DIAGNOSIS — N362 Urethral caruncle: Secondary | ICD-10-CM | POA: Diagnosis not present

## 2018-03-04 ENCOUNTER — Ambulatory Visit
Admission: RE | Admit: 2018-03-04 | Discharge: 2018-03-04 | Disposition: A | Discharge status: Home / Self Care | Payer: Medicare HMO | Source: Ambulatory Visit | Attending: Family Medicine | Admitting: Family Medicine

## 2018-03-04 DIAGNOSIS — Z1231 Encounter for screening mammogram for malignant neoplasm of breast: Secondary | ICD-10-CM

## 2018-03-04 NOTE — Progress Notes (Signed)
03/03/2018 4:04 PM   Maria Krueger 1945-04-13 [id number]  Referring provider: Maryland Pink, MD [address] [facility] [place], [place] [address]  Chief Complaint  Patient presents with  . New Patient (Initial Visit)    HPI: 73 year old female previously seen in 2017 for urethral caruncle who returns today for follow-up.  At that time, she was seen and evaluated for slight pink tinge on her panty liner concerning for blood.  On exam, she is noted to have a small urethral carbuncle presumably the source of her bloody discharge.  At the time, she was asymptomatic thus no further intervention was deemed necessary.  More recently, she was seen and evaluated by her GYN provider, nurse midwife Dalia Heading.  On examination, the urethral carbuncle was again noted.  She was advised to resume topical estrogen cream.  She noticed decreased spotting on her pad after completing a 2-week course but when she cut back to 2 times a week, she noticed recurrence of this.  She denies any urethral pain, burning with urination, or mass.  Other than seeing the spotting, she is otherwise asymptomatic.  Review of multiple previous urinalysis over the past several years revealed no microscopic hematuria.  UA today is also negative.   PMH: Past Medical History:  Diagnosis Date  . Heart murmur   . Hemorrhoids 05/14/2001   internal non-bleeding (pre conoloscopy)  . History of mammogram 01/06/2013; 02/25/16   birads 2; neg  . Osteopenia 11/14; 11/16   left femoral neck and hip and spine; FRAX hip = 3.7% 11/16  . Osteoporosis 2012   lumbar spine  . Squamous cell carcinoma     Surgical History: Past Surgical History:  Procedure Laterality Date  . COLONOSCOPY  05/14/2001/ 2013   non bleeding internal hemorrhoids  . HAND SURGERY Left 1973   tendon tumor left pointer finger  . LIPOMA EXCISION  1972   waist  . SKIN CANCER EXCISION  2006; 2009   nose-basal cell ca; right ring finger -  sq. cell ca    Home Medications:  Allergies as of 03/03/2018      Reactions   Ampicillin Rash      Medication List       Accurate as of March 03, 2018 11:59 PM. Always use your most recent med list.        Estradiol 10 MCG Tabs vaginal tablet Place 1 tablet (10 mcg total) vaginally 2 (two) times a week.   OVER THE COUNTER MEDICATION Take 1 tablet by mouth daily.   OVER THE COUNTER MEDICATION Take 2 capsules by mouth daily.   OVER THE COUNTER MEDICATION Take 1 capsule by mouth daily.   OVER THE COUNTER MEDICATION Take 1 capsule by mouth 2 (two) times daily.   Vitamin D3 5000 UNIT/ML Liqd Take 25,000 Units by mouth daily.       Allergies:  Allergies  Allergen Reactions  . Ampicillin Rash    Family History: Family History  Problem Relation Age of Onset  . Stroke Mother        d/t viox  . Diabetes Mother        type 2  . Heart attack Father   . Cancer Father 66       lung  . Bladder Cancer Neg Hx   . Kidney cancer Neg Hx   . Prostate cancer Neg Hx   . Breast cancer Neg Hx   . Ovarian cancer Neg Hx     Social  History:  reports that she has never smoked. She has never used smokeless tobacco. She reports that she does not drink alcohol or use drugs.  ROS: UROLOGY Frequent Urination?: No Hard to postpone urination?: No Burning/pain with urination?: No Get up at night to urinate?: No Leakage of urine?: No Urine stream starts and stops?: No Trouble starting stream?: No Do you have to strain to urinate?: No Blood in urine?: No Urinary tract infection?: No Sexually transmitted disease?: No Injury to kidneys or bladder?: No Painful intercourse?: No Weak stream?: No Currently pregnant?: No Vaginal bleeding?: No Last menstrual period?: n  Gastrointestinal Nausea?: No Vomiting?: No Indigestion/heartburn?: No Diarrhea?: No Constipation?: No  Constitutional Fever: No Night sweats?: No Weight loss?: No Fatigue?: No  Skin Skin  rash/lesions?: No Itching?: No  Eyes Blurred vision?: No Double vision?: No  Ears/Nose/Throat Sore throat?: No Sinus problems?: No  Hematologic/Lymphatic Swollen glands?: No Easy bruising?: No  Cardiovascular Leg swelling?: No Chest pain?: No  Respiratory Cough?: No Shortness of breath?: No  Endocrine Excessive thirst?: No  Musculoskeletal Back pain?: No Joint pain?: No  Neurological Headaches?: No Dizziness?: No  Psychologic Depression?: No Anxiety?: No  Physical Exam: BP 131/80 (BP Location: Left Arm, Patient Position: Sitting, Cuff Size: Normal)   Pulse 83   Ht 5' 4.5" (1.638 m)   Wt 115 lb 9.6 oz (52.4 kg)   BMI 19.54 kg/m   Constitutional:  Alert and oriented, No acute distress. HEENT: [place] AT, moist mucus membranes.  Trachea midline, no masses. Cardiovascular: No clubbing, cyanosis, or edema. Respiratory: Normal respiratory effort, no increased work of breathing. GI: Abdomen is soft, nontender, nondistended, no abdominal masses GU: Pelvic exam chaperoned by Fonnie Jarvis, CMA.  Normal external genitalia with atrophic vaginal mucosa appreciated.  Normal urethral meatus with a small 2 to 3 mm urethral caruncle at the 6 o'clock position appreciated, does not appear particularly inflamed with no active bleeding noted. Skin: No rashes, bruises or suspicious lesions. Neurologic: Grossly intact, no focal deficits, moving all 4 extremities. Psychiatric: Normal mood and affect.  Laboratory Data: Lab Results  Component Value Date   WBC 3.5 (L) 06/15/2012   HGB 12.0 06/15/2012   HCT 34.4 (L) 06/15/2012   MCV 87 06/15/2012   PLT 121 (L) 06/15/2012    Lab Results  Component Value Date   CREATININE 0.98 06/14/2012    Urinalysis UA from 10/01/2017, 06/2015, 03/2015 all reviewed, no evidence of microscopic blood.  Assessment & Plan:    1. Urethral caruncle Relatively asymptomatic quite small urethral carbuncle which appears stable from several years  ago  Discussed the risk and benefits of topical estrogen cream at length including the risk of systemic absorption which is relatively low, no contraindications.  On her bother, she will either continue this medication or discontinue, may resume as needed for symptomatic relief.  We reviewed her anatomy and how and where to apply the medication.  Given that she is relatively asymptomatic and very small size, surgical excision does not seem necessary or appropriate.  To let us know if she becomes more symptomatic and would like to consider surgical excision in the future.  Patient was reassured.  F/u prn  Hollice Espy, MD  [facility] [address] [place], [place] [address] [phone]

## 2018-03-09 ENCOUNTER — Ambulatory Visit: Payer: Medicare Other | Admitting: Urology

## 2019-02-10 ENCOUNTER — Other Ambulatory Visit: Payer: Self-pay | Admitting: Certified Nurse Midwife

## 2019-02-10 ENCOUNTER — Telehealth: Payer: Self-pay | Admitting: Certified Nurse Midwife

## 2019-02-10 DIAGNOSIS — Z1231 Encounter for screening mammogram for malignant neoplasm of breast: Secondary | ICD-10-CM

## 2019-02-10 NOTE — Telephone Encounter (Signed)
Patient is calling to see if if Jaclyn Shaggy will put in an order for her to schedule for her mammogram. Patient isn't due for annual in til after 09/29/19 due to insurance please advise

## 2019-02-10 NOTE — Telephone Encounter (Signed)
Patient is aware 

## 2019-02-10 NOTE — Telephone Encounter (Signed)
Hi Maria Krueger, please let her know that I placed the order. She can call to schedule . It is not due till after 30 January

## 2019-03-08 ENCOUNTER — Ambulatory Visit
Admission: RE | Admit: 2019-03-08 | Discharge: 2019-03-08 | Disposition: A | Discharge status: Home / Self Care | Payer: Medicare HMO | Source: Ambulatory Visit | Attending: Certified Nurse Midwife | Admitting: Certified Nurse Midwife

## 2019-03-08 DIAGNOSIS — Z1231 Encounter for screening mammogram for malignant neoplasm of breast: Secondary | ICD-10-CM | POA: Diagnosis present

## 2019-06-06 ENCOUNTER — Ambulatory Visit: Payer: Medicare HMO | Admitting: Dermatology

## 2019-08-31 ENCOUNTER — Encounter: Payer: Self-pay | Admitting: Dermatology

## 2019-08-31 ENCOUNTER — Ambulatory Visit: Payer: Medicare HMO | Admitting: Dermatology

## 2019-08-31 ENCOUNTER — Other Ambulatory Visit: Payer: Self-pay

## 2019-08-31 DIAGNOSIS — Z1283 Encounter for screening for malignant neoplasm of skin: Secondary | ICD-10-CM | POA: Diagnosis not present

## 2019-08-31 DIAGNOSIS — D229 Melanocytic nevi, unspecified: Secondary | ICD-10-CM

## 2019-08-31 DIAGNOSIS — L820 Inflamed seborrheic keratosis: Secondary | ICD-10-CM

## 2019-08-31 DIAGNOSIS — D224 Melanocytic nevi of scalp and neck: Secondary | ICD-10-CM | POA: Diagnosis not present

## 2019-08-31 DIAGNOSIS — L821 Other seborrheic keratosis: Secondary | ICD-10-CM

## 2019-08-31 DIAGNOSIS — D489 Neoplasm of uncertain behavior, unspecified: Secondary | ICD-10-CM

## 2019-08-31 DIAGNOSIS — D1801 Hemangioma of skin and subcutaneous tissue: Secondary | ICD-10-CM

## 2019-08-31 DIAGNOSIS — D485 Neoplasm of uncertain behavior of skin: Secondary | ICD-10-CM

## 2019-08-31 DIAGNOSIS — L814 Other melanin hyperpigmentation: Secondary | ICD-10-CM

## 2019-08-31 DIAGNOSIS — L578 Other skin changes due to chronic exposure to nonionizing radiation: Secondary | ICD-10-CM

## 2019-08-31 NOTE — Patient Instructions (Signed)
Recommend daily broad spectrum sunscreen SPF 30+ to sun-exposed areas, reapply every 2 hours as needed. Call for new or changing lesions.  

## 2019-08-31 NOTE — Progress Notes (Signed)
Follow-Up Visit   Subjective  Maria Krueger is a 74 y.o. female who presents for the following: TBSE. The patient presents for Total-Body Skin Exam (TBSE) for skin cancer screening and mole check. Patient presents today for annual TBSE, has an area of concern on her left forehead that she would like to have removed. Patient has a h/o BCC and SCC from years ago  The following portions of the chart were reviewed this encounter and updated as appropriate:  Tobacco  Allergies  Meds  Problems  Med Hx  Surg Hx  Fam Hx     Review of Systems:  No other skin or systemic complaints except as noted in HPI or Assessment and Plan.  Objective  Well appearing patient in no apparent distress; mood and affect are within normal limits.  A full examination was performed including scalp, head, eyes, ears, nose, lips, neck, chest, axillae, abdomen, back, buttocks, bilateral upper extremities, bilateral lower extremities, hands, feet, fingers, toes, fingernails, and toenails. All findings within normal limits unless otherwise noted below.  Objective  Right Forehead x 2 (2): Red papules.   Objective  Left Thigh - Anterior Superior: 0.4 cm irregular brown macule  Objective  Left Thigh - Anterior Inferior: 0.4 cm irregular brown macule   Assessment & Plan  Nevus Neck - Anterior Nevus vs sebaceous hyperplasia Benign-appearing.  Observation.  Call clinic for new or changing moles.  Recommend daily use of broad spectrum spf 30+ sunscreen to sun-exposed areas.   Inflamed seborrheic keratosis (3) Right Forearm; Right  Mandible x 2 (2) Cryotherapy today Prior to procedure, discussed risks of blister formation, small wound, skin dyspigmentation, or rare scar following cryotherapy.    Destruction of lesion - Right  Mandible x 2, Right Forearm Complexity: simple   Destruction method: cryotherapy   Informed consent: discussed and consent obtained   Timeout:  patient name, date of birth,  surgical site, and procedure verified Lesion destroyed using liquid nitrogen: Yes   Region frozen until ice ball extended beyond lesion: Yes   Outcome: patient tolerated procedure well with no complications   Post-procedure details: wound care instructions given    Hemangioma of skin (2) Right Forehead x 2 Cosmetic removal with ED ($60 for first and $15 for each additional equals $75 out-of-pocket) Destruction of lesion - Right Forehead x 2  Destruction method: electrodesiccation and curettage   Patient was prepped and draped in usual sterile fashion: alocohol. Anesthesia: the lesion was anesthetized in a standard fashion   Anesthetic:  1% lidocaine w/ epinephrine 1-100,000 buffered w/ 8.4% NaHCO3 Hemostasis achieved with:  pressure Post-procedure details: wound care instructions given   Post-procedure details comment:  Bandage applied with mupirocin ointment  Neoplasm of uncertain behavior (2) Left Thigh - Anterior Superior  Skin / nail biopsy  Specimen 1 - Surgical pathology Differential Diagnosis: nevus r/o dysplasia Check Margins: No 0.4 cm irregular brown macule  Left Thigh - Anterior Inferior  Skin / nail biopsy  Specimen 2 - Surgical pathology Differential Diagnosis: nevus r/o dysplasia Check Margins: No 0.4 cm irregular brown macule   Lentigines - Scattered tan macules - Discussed due to sun exposure - Benign, observe - Call for any changes  Seborrheic Keratoses - Stuck-on, waxy, tan-brown papules and plaques  - Discussed benign etiology and prognosis. - Observe - Call for any changes  Melanocytic Nevi - Tan-brown and/or pink-flesh-colored symmetric macules and papules - Benign appearing on exam today - Observation - Call clinic for new or changing  moles - Recommend daily use of broad spectrum spf 30+ sunscreen to sun-exposed areas.   Hemangiomas - Red papules - Discussed benign nature - Observe - Call for any changes  Actinic Damage - diffuse  scaly erythematous macules with underlying dyspigmentation - Recommend daily broad spectrum sunscreen SPF 30+ to sun-exposed areas, reapply every 2 hours as needed.  - Call for new or changing lesions.  Skin cancer screening performed today. History of Skin Cancer   Clear. Observe for recurrence. Call clinic for new or changing lesions.  Recommend regular skin exams, daily broad-spectrum spf 30+ sunscreen use, and photoprotection.      Return in about 1 year (around 08/30/2020) for TBSE.  I, Donzetta Kohut, CMA, am acting as scribe for [name], MD . Documentation: I have reviewed the above documentation for accuracy and completeness, and I agree with the above.  [name], MD

## 2019-09-04 ENCOUNTER — Encounter: Payer: Self-pay | Admitting: Dermatology

## 2019-09-12 ENCOUNTER — Telehealth: Payer: Self-pay

## 2019-09-12 NOTE — Telephone Encounter (Signed)
-----   Message from Ralene Bathe, MD sent at 09/08/2019  6:22 PM EDT ----- 1. 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 Skin , left thigh - anterior inferior DYSPLASTIC NEVUS WITH MODERATE TO SEVERE ATYPIA, LATERAL MARGIN INVOLVED, SEE DESCRIPTION  1- dysplastic Moderate Recheck next visit 2- Moderate to Severe Dysplastic Schedule for shave removal

## 2019-09-12 NOTE — Telephone Encounter (Signed)
Patient informed of pathology results and appointment scheduled.  °

## 2019-10-03 ENCOUNTER — Other Ambulatory Visit: Payer: Self-pay

## 2019-10-03 ENCOUNTER — Encounter: Payer: Self-pay | Admitting: Obstetrics and Gynecology

## 2019-10-03 ENCOUNTER — Ambulatory Visit (INDEPENDENT_AMBULATORY_CARE_PROVIDER_SITE_OTHER): Payer: Medicare HMO | Admitting: Obstetrics and Gynecology

## 2019-10-03 VITALS — BP 120/60 | Ht 64.0 in | Wt 119.0 lb

## 2019-10-03 DIAGNOSIS — M8589 Other specified disorders of bone density and structure, multiple sites: Secondary | ICD-10-CM | POA: Diagnosis not present

## 2019-10-03 DIAGNOSIS — Z1231 Encounter for screening mammogram for malignant neoplasm of breast: Secondary | ICD-10-CM

## 2019-10-03 DIAGNOSIS — N952 Postmenopausal atrophic vaginitis: Secondary | ICD-10-CM | POA: Diagnosis not present

## 2019-10-03 DIAGNOSIS — Z01419 Encounter for gynecological examination (general) (routine) without abnormal findings: Secondary | ICD-10-CM

## 2019-10-03 DIAGNOSIS — N362 Urethral caruncle: Secondary | ICD-10-CM

## 2019-10-03 NOTE — Progress Notes (Signed)
PCP: Maryland Pink, MD   Chief Complaint  Patient presents with  . Gynecologic Exam    HPI:      [name] is a 74 y.o. [id number] whose LMP was No LMP recorded. Patient is postmenopausal., presents today for her annual examination.  Her menses are absent due to menopause. She does not have vasomotor sx.   Sex activity: single partner, contraception - post menopausal status. She does not have vaginal dryness. Was using vagifem but fine without it now.   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 Followed by urology, no tx needed.   Last mammogram: 03/08/19  Results were: normal--routine follow-up in 12 months There is no FH of breast cancer. There is no FH of ovarian cancer. The patient does do self-breast exams.  Colonoscopy: 2013 was normal,  Repeat due after 10 years.   DEXA 2018 with osteopenia, doing ca/vit D supp. D[facility] 2/21 at [facility] with osteopenia in hip and spine. FRAX model, the 10 year probability of a major osteoporotic fracture is 18%. The 10 year probability of a hip fracture is 8.1%; due to low bone mass. Pt did boniva in past but stopped it. Prefers to not restart currently and cont vits/exercise. Wants to recheck DEXA in 2 yrs.   Tobacco use: The patient denies current or previous tobacco use. Alcohol use: none  No drug use Exercise: very active  She does get adequate calcium and Vitamin D in her diet.  Labs with PCP.   Past Medical History:  Diagnosis Date  . Dysplastic nevus 06/17/2016   L post lat thigh  . Heart murmur   . Hemorrhoids 05/14/2001   internal non-bleeding (pre conoloscopy)  . History of mammogram 01/06/2013; 02/25/16   birads 2; neg  . Osteopenia 11/14; 11/16   left femoral neck and hip and spine; FRAX hip = 3.7% 11/16  . Osteoporosis 2012   lumbar spine    Past Surgical History:  Procedure Laterality Date  . COLONOSCOPY  05/14/2001/ 2013   non bleeding  internal hemorrhoids  . HAND SURGERY Left 1973   tendon tumor left pointer finger  . LIPOMA EXCISION  1972   waist  . SKIN CANCER EXCISION  2006; 2009   nose-basal cell ca; right ring finger - sq. cell ca    Family History  Problem Relation Age of Onset  . Stroke Mother        d/t viox  . Diabetes Mother        type 2  . Heart attack Father   . Cancer Father 2       lung  . Bladder Cancer Neg Hx   . Kidney cancer Neg Hx   . Prostate cancer Neg Hx   . Breast cancer Neg Hx   . Ovarian cancer Neg Hx     Social History   Socioeconomic History  . Marital status: Married    Spouse name: Not on file  . Number of children: 2  . Years of education: 46  . Highest education level: Not on file  Occupational History  . Occupation: RETIRED  Tobacco Use  . Smoking status: Never Smoker  . Smokeless tobacco: Never Used  Vaping Use  . Vaping Use: Never used  Substance and Sexual Activity  . Alcohol use: No    Alcohol/week: 0.0 standard drinks  . Drug use: No  . Sexual activity: Yes  Partners: Male    Birth control/protection: Post-menopausal  Other Topics Concern  . Not on file  Social History Narrative  . Not on file   Social Determinants of Health   Financial Resource Strain:   . Difficulty of Paying Living Expenses: Not on file  Food Insecurity:   . Worried About Charity fundraiser in the Last Year: Not on file  . Ran Out of Food in the Last Year: Not on file  Transportation Needs:   . Lack of Transportation (Medical): Not on file  . Lack of Transportation (Non-Medical): Not on file  Physical Activity:   . Days of Exercise per Week: Not on file  . Minutes of Exercise per Session: Not on file  Stress:   . Feeling of Stress : Not on file  Social Connections:   . Frequency of Communication with Friends and Family: Not on file  . Frequency of Social Gatherings with Friends and Family: Not on file  . Attends Religious Services: Not on file  . Active Member of  Clubs or Organizations: Not on file  . Attends Archivist Meetings: Not on file  . Marital Status: Not on file  Intimate Partner Violence:   . Fear of Current or Ex-Partner: Not on file  . Emotionally Abused: Not on file  . Physically Abused: Not on file  . Sexually Abused: Not on file     Current Outpatient Medications:  .  Cholecalciferol (VITAMIN D3) 5000 UNIT/ML LIQD, Take 25,000 Units by mouth daily., Disp: , Rfl:  .  latanoprost (XALATAN) 0.005 % ophthalmic solution, INSTILL 1 DROP INTO EACH EYE AT BEDTIME, Disp: , Rfl:  .  OVER THE COUNTER MEDICATION, Take 1 tablet by mouth daily., Disp: , Rfl:  .  OVER THE COUNTER MEDICATION, Take 2 capsules by mouth daily., Disp: , Rfl:  .  OVER THE COUNTER MEDICATION, Take 1 capsule by mouth daily., Disp: , Rfl:  .  OVER THE COUNTER MEDICATION, Take 1 capsule by mouth 2 (two) times daily., Disp: , Rfl:  .  Estradiol 10 MCG TABS vaginal tablet, Place 1 tablet (10 mcg total) vaginally 2 (two) times a week. (Patient not taking: Reported on 03/03/2018), Disp: 8 tablet, Rfl: 11     ROS:  Review of Systems  Constitutional: Negative for fatigue, fever and unexpected weight change.  Respiratory: Negative for cough, shortness of breath and wheezing.   Cardiovascular: Negative for chest pain, palpitations and leg swelling.  Gastrointestinal: Negative for blood in stool, constipation, diarrhea, nausea and vomiting.  Endocrine: Negative for cold intolerance, heat intolerance and polyuria.  Genitourinary: Negative for dyspareunia, dysuria, flank pain, frequency, genital sores, hematuria, menstrual problem, pelvic pain, urgency, vaginal bleeding, vaginal discharge and vaginal pain.  Musculoskeletal: Negative for back pain, joint swelling and myalgias.  Skin: Negative for rash.  Neurological: Negative for dizziness, syncope, light-headedness, numbness and headaches.  Hematological: Negative for adenopathy.  Psychiatric/Behavioral: Negative for  agitation, confusion, sleep disturbance and suicidal ideas. The patient is not nervous/anxious.   BREAST: No symptoms    Objective: BP 120/60   Ht 5\' 4"  (1.626 m)   Wt 119 lb (54 kg)   BMI 20.43 kg/m    Physical Exam Constitutional:      Appearance: She is well-developed.  Genitourinary:     Vulva, vagina, cervix, uterus, right adnexa and left adnexa normal.     No vulval lesion or tenderness noted.     No vaginal discharge, erythema or tenderness.  No cervical polyp.     Uterus is not enlarged or tender.     No right or left adnexal mass present.     Right adnexa not tender.     Left adnexa not tender.  Neck:     Thyroid: No thyromegaly.  Cardiovascular:     Rate and Rhythm: Normal rate and regular rhythm.     Heart sounds: Normal heart sounds. No murmur heard.   Pulmonary:     Effort: Pulmonary effort is normal.     Breath sounds: Normal breath sounds.  Chest:     Breasts:        Right: No mass, nipple discharge, skin change or tenderness.        Left: No mass, nipple discharge, skin change or tenderness.  Abdominal:     Palpations: Abdomen is soft.     Tenderness: There is no abdominal tenderness. There is no guarding.  Musculoskeletal:        General: Normal range of motion.     Cervical back: Normal range of motion.  Neurological:     General: No focal deficit present.     Mental Status: She is alert and oriented to person, place, and time.     Cranial Nerves: No cranial nerve deficit.  Skin:    General: Skin is warm and dry.  Psychiatric:        Mood and Affect: Mood normal.        Behavior: Behavior normal.        Thought Content: Thought content normal.        Judgment: Judgment normal.  Vitals reviewed.     Assessment/Plan:  Encounter for annual routine gynecological examination  Encounter for screening mammogram for malignant neoplasm of breast - Plan: MM 3D SCREEN BREAST BILATERAL; pt to sched mammo  Postmenopausal atrophic  vaginitis--declines vag ERT for now. Was doing vagifem, ok without it.  Osteopenia of multiple sites--on DEXA 2021. Increased risk of hip fx due to low bone mass. Pt declines tx for now, will cont ca/Vit D and exercise. Recheck in 2 yrs.   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 F/u prn sx change          GYN counsel mammography screening, menopause, osteoporosis, adequate intake of calcium and vitamin D, diet and exercise    F/U  Return in about 2 years (around 10/02/2021)./ or can be released from GYN care, based on pt pref.   Tion Tse B. Sherald Balbuena, PA-C 10/03/2019 2:14 PM

## 2019-10-03 NOTE — Patient Instructions (Signed)
I value your feedback and entrusting us with your care. If you get a [facility] patient survey, I would appreciate you taking the time to let us know about your experience today. Thank you!  As of January 13, 2019, your lab results will be released to your MyChart immediately, before I even have a chance to see them. Please give me time to review them and contact you if there are any abnormalities. Thank you for your patience.  

## 2019-11-21 ENCOUNTER — Other Ambulatory Visit: Payer: Self-pay

## 2019-11-21 ENCOUNTER — Emergency Department: Payer: Medicare HMO

## 2019-11-21 ENCOUNTER — Emergency Department
Admission: EM | Admit: 2019-11-21 | Discharge: 2019-11-21 | Disposition: A | Discharge status: Home / Self Care | Payer: Medicare HMO | Attending: Emergency Medicine | Admitting: Emergency Medicine

## 2019-11-21 DIAGNOSIS — G459 Transient cerebral ischemic attack, unspecified: Secondary | ICD-10-CM | POA: Diagnosis not present

## 2019-11-21 DIAGNOSIS — Z85828 Personal history of other malignant neoplasm of skin: Secondary | ICD-10-CM | POA: Diagnosis not present

## 2019-11-21 DIAGNOSIS — R1310 Dysphagia, unspecified: Secondary | ICD-10-CM | POA: Diagnosis present

## 2019-11-21 LAB — CBC
HCT: 38.1 % (ref 36.0–46.0)
Hemoglobin: 12.9 g/dL (ref 12.0–15.0)
MCH: 29.7 pg (ref 26.0–34.0)
MCHC: 33.9 g/dL (ref 30.0–36.0)
MCV: 87.8 fL (ref 80.0–100.0)
Platelets: 149 10*3/uL — ABNORMAL LOW (ref 150–400)
RBC: 4.34 MIL/uL (ref 3.87–5.11)
RDW: 12.5 % (ref 11.5–15.5)
WBC: 5.6 10*3/uL (ref 4.0–10.5)
nRBC: 0 % (ref 0.0–0.2)

## 2019-11-21 LAB — COMPREHENSIVE METABOLIC PANEL
ALT: 13 U/L (ref 0–44)
AST: 20 U/L (ref 15–41)
Albumin: 4.5 g/dL (ref 3.5–5.0)
Alkaline Phosphatase: 74 U/L (ref 38–126)
Anion gap: 9 (ref 5–15)
BUN: 25 mg/dL — ABNORMAL HIGH (ref 8–23)
CO2: 29 mmol/L (ref 22–32)
Calcium: 9.4 mg/dL (ref 8.9–10.3)
Chloride: 100 mmol/L (ref 98–111)
Creatinine, Ser: 0.83 mg/dL (ref 0.44–1.00)
GFR, Estimated: >60 mL/min (ref >60)
Glucose, Bld: 133 mg/dL — ABNORMAL HIGH (ref 70–99)
Potassium: 3.6 mmol/L (ref 3.5–5.1)
Sodium: 138 mmol/L (ref 135–145)
Total Bilirubin: 0.7 mg/dL (ref 0.3–1.2)
Total Protein: 7.5 g/dL (ref 6.5–8.1)

## 2019-11-21 LAB — DIFFERENTIAL
Abs Immature Granulocytes: 0.02 10*3/uL (ref 0.00–0.07)
Basophils Absolute: 0 10*3/uL (ref 0.0–0.1)
Basophils Relative: 0 %
Eosinophils Absolute: 0.2 10*3/uL (ref 0.0–0.5)
Eosinophils Relative: 4 %
Immature Granulocytes: 0 %
Lymphocytes Relative: 29 %
Lymphs Abs: 1.6 10*3/uL (ref 0.7–4.0)
Monocytes Absolute: 0.4 10*3/uL (ref 0.1–1.0)
Monocytes Relative: 7 %
Neutro Abs: 3.3 10*3/uL (ref 1.7–7.7)
Neutrophils Relative %: 60 %

## 2019-11-21 LAB — APTT: aPTT: 30 seconds (ref 24–36)

## 2019-11-21 LAB — PROTIME-INR
INR: 1 (ref 0.8–1.2)
Prothrombin Time: 12.3 seconds (ref 11.4–15.2)

## 2019-11-21 MED ORDER — SODIUM CHLORIDE 0.9% FLUSH: 3.0000 mL | Freq: Once | INTRAVENOUS | Status: DC

## 2019-11-21 NOTE — ED Notes (Signed)
D/C instructions given.  Advised of follow up.  Understanding verbalized.  Pt left ER ambulatory.

## 2019-11-21 NOTE — ED Notes (Signed)
This RN spoke with MD; Normal head CT and labs ordered per MD.

## 2019-11-21 NOTE — Discharge Instructions (Signed)
Please call your neurologist to arrange a follow-up appointment within the next several days.  Return to the emergency department for any weakness or numbness of any arm or leg confusion slurred speech or any other symptom personally concerning to yourself.

## 2019-11-21 NOTE — ED Provider Notes (Signed)
[facility] Emergency Department Provider Note  Time seen: 12:14 PM  I have reviewed the triage vital signs and the nursing notes.   HISTORY  Chief Complaint Dysphagia and Voice Change   HPI Maria Krueger is a 74 y.o. female presents to the emergency department with difficulty speaking and swallowing last night which has since resolved.  According to the patient she had fallen asleep watching TV, went to wake her husband up around midnight and felt like she was having trouble getting her words out, tried to drink some water but felt like she was having trouble swallowing the water as well.  Patient states ultimately she went to sleep, today has had no further issues.  No trouble swallowing or speaking.  Denies feeling weak or numb in any arm or leg at any point.  Denies any confusion.  No history of stroke in the past.  No TIA history.  Patient states she does have a history of "silent migraines" and follows up with Dr. Brigitte Pulse of neurology.  Past Medical History:  Diagnosis Date  . Dysplastic nevus 06/02/2016   L post lat thigh. Severe atypia, lateral margin involved. Excised 06/17/2016, margins free.   Marland Kitchen Dysplastic nevus 08/31/2019   Left thigh. Moderate atypia, limited margins free.  Marland Kitchen Dysplastic nevus 08/31/2019   Left thigh, anterior inferior. Moderate to severe atypia, lateral margin involved.   Marland Kitchen Heart murmur   . Hemorrhoids 05/14/2001   internal non-bleeding (pre conoloscopy)  . History of mammogram 01/06/2013; 02/25/16   birads 2; neg  . Osteopenia 11/14; 11/16   left femoral neck and hip and spine; FRAX hip = 3.7% 11/16  . Osteoporosis 2012   lumbar spine    Patient Active Problem List   Diagnosis Date Noted  . Postmenopausal atrophic vaginitis 10/03/2019  . 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 Squamous cell carcinoma   . Osteopenia   . Osteoporosis 02/03/2010    Past Surgical History:  Procedure Laterality Date  . COLONOSCOPY  05/14/2001/ 2013    non bleeding internal hemorrhoids  . HAND SURGERY Left 1973   tendon tumor left pointer finger  . LIPOMA EXCISION  1972   waist  . SKIN CANCER EXCISION  2006; 2009   nose-basal cell ca; right ring finger - sq. cell ca    Prior to Admission medications   Medication Sig Start Date End Date Taking? Authorizing Provider  Cholecalciferol (VITAMIN D3) 5000 UNIT/ML LIQD Take 25,000 Units by mouth daily.    [provider]  Estradiol 10 MCG TABS vaginal tablet Place 1 tablet (10 mcg total) vaginally 2 (two) times a week. Patient not taking: Reported on 03/03/2018 09/28/17   Dalia Heading, CNM  latanoprost (XALATAN) 0.005 % ophthalmic solution INSTILL 1 DROP INTO EACH EYE AT BEDTIME 11/26/18   [provider]  OVER THE COUNTER MEDICATION Take 1 tablet by mouth daily.    [provider]  OVER THE COUNTER MEDICATION Take 2 capsules by mouth daily.    [provider]  OVER THE COUNTER MEDICATION Take 1 capsule by mouth daily.    [provider]  OVER THE COUNTER MEDICATION Take 1 capsule by mouth 2 (two) times daily.    [provider]    Allergies  Allergen Reactions  . Prednisone Other (See Comments)  . Ampicillin Rash    Family History  Problem Relation Age of Onset  . Stroke Mother        d/t viox  . Diabetes Mother  type 2  . Heart attack Father   . Cancer Father 8       lung  . Bladder Cancer Neg Hx   . Kidney cancer Neg Hx   . Prostate cancer Neg Hx   . Breast cancer Neg Hx   . Ovarian cancer Neg Hx     Social History Social History   Tobacco Use  . Smoking status: Never Smoker  . Smokeless tobacco: Never Used  Vaping Use  . Vaping Use: Never used  Substance Use Topics  . Alcohol use: No    Alcohol/week: 0.0 standard drinks  . Drug use: No    Review of Systems Constitutional: Negative for fever. Cardiovascular: Negative for chest pain. Respiratory: Negative for shortness of  breath. Gastrointestinal: Negative for abdominal pain, vomiting  Musculoskeletal: Negative for musculoskeletal complaints Neurological: Negative for headache.  Difficulty speaking last night. All other ROS negative  ____________________________________________   PHYSICAL EXAM:  VITAL SIGNS: ED Triage Vitals  Enc Vitals Group     BP 11/21/19 0148 (!) 131/58     Pulse Rate 11/21/19 0148 77     Resp 11/21/19 0148 16     Temp 11/21/19 0148 97.6 F (36.4 C)     Temp Source 11/21/19 0148 Oral     SpO2 11/21/19 0148 100 %     Weight 11/21/19 0149 119 lb 0.8 oz (54 kg)     Height 11/21/19 0149 5\' 4"  (1.626 m)     Head Circumference --      Peak Flow --      Pain Score 11/21/19 0148 0     Pain Loc --      Pain Edu? --      Excl. in [place]? --    Constitutional: Alert and oriented. Well appearing and in no distress. Eyes: Normal exam ENT      Head: Normocephalic and atraumatic.      Mouth/Throat: Mucous membranes are moist. Cardiovascular: Normal rate, regular rhythm.  Respiratory: Normal respiratory effort without tachypnea nor retractions. Breath sounds are clear  Gastrointestinal: Soft and nontender. No distention.   Musculoskeletal: Nontender with normal range of motion in all extremities. Neurologic:  Normal speech and language. No gross focal neurologic deficits.  Equal grip strength bilaterally.  No pronator drift.  5/5 motor in all extremities.  No lower extremity drift.  Cranial nerves intact. Skin:  Skin is warm, dry and intact.  Psychiatric: Mood and affect are normal.  ____________________________________________    EKG  EKG viewed and interpreted by myself shows a normal sinus rhythm at 62 bpm with a narrow QRS, normal axis, normal intervals, no concerning ST changes.  ____________________________________________    RADIOLOGY  CT scan of the head is negative. MRI is essentially negative for acute  abnormality.  ____________________________________________   INITIAL IMPRESSION / ASSESSMENT AND PLAN / ED COURSE  Pertinent labs & imaging results that were available during my care of the patient were reviewed by me and considered in my medical decision making (see chart for details).   Patient presents emergency department for difficulty speaking and swallowing last night which has since resolved.  Currently no issues.  Reassuring/intact neurological exam with no findings at this time.  Patient's labs are largely within normal limits, we will obtain an EKG.  CT scan of the head shows no acute abnormality however given the patient's symptom description we will proceed with an MRI for further more in-depth evaluation to rule out CVA.  Symptoms are at a  minimum quite suspicious for TIA.  However as the patient has no symptoms at this time if the MRI is negative I do believe the patient would be safe for follow-up with her neurologist Dr. Manuella Ghazi.  Patient agreeable to plan of care.  NIH stroke scale of 0 currently.  Patient's MRI has resulted negative for acute abnormality.  We will discharge the patient with neurology follow-up with her neurologist.  I discussed return precautions.  Patient agreeable to plan of care.   Maria Krueger was evaluated in Emergency Department on 11/21/2019 for the symptoms described in the history of present illness. She was evaluated in the context of the global COVID-19 pandemic, which necessitated consideration that the patient might be at risk for infection with the SARS-CoV-2 virus that causes COVID-19. Institutional protocols and algorithms that pertain to the evaluation of patients at risk for COVID-19 are in a state of rapid change based on information released by regulatory bodies including the CDC and federal and state organizations. These policies and algorithms were followed during the patient's care in the  ED.  ____________________________________________   FINAL CLINICAL IMPRESSION(S) / ED DIAGNOSES  TIA   Harvest Dark, MD 11/21/19 1512

## 2019-11-21 NOTE — ED Triage Notes (Signed)
Patient reports that she awoke around midnight (fell asleep normal at 2200) and that her voice sounded wrong to both her and her husband. Patient also reports that the left half of her throat felt like it was not moving correctly when she tried to swallow water, and the water actually went up into her nose. Patient AO X 4, NIH of 0 at this time.

## 2019-11-29 ENCOUNTER — Encounter: Payer: Self-pay | Admitting: Dermatology

## 2019-11-29 ENCOUNTER — Other Ambulatory Visit: Payer: Self-pay

## 2019-11-29 ENCOUNTER — Ambulatory Visit: Payer: Medicare HMO | Admitting: Dermatology

## 2019-11-29 DIAGNOSIS — D2272 Melanocytic nevi of left lower limb, including hip: Secondary | ICD-10-CM | POA: Diagnosis not present

## 2019-11-29 DIAGNOSIS — D239 Other benign neoplasm of skin, unspecified: Secondary | ICD-10-CM

## 2019-11-29 NOTE — Progress Notes (Signed)
   Follow-Up Visit   Subjective  Maria Krueger is a 74 y.o. female who presents for the following: Follow-up (Biopsy proven moderate - severe dysplastic nevus of left thigh ant inf - shave removal today).  The following portions of the chart were reviewed this encounter and updated as appropriate:  Tobacco  Allergies  Meds  Problems  Med Hx  Surg Hx  Fam Hx     Review of Systems:  No other skin or systemic complaints except as noted in HPI or Assessment and Plan.  Objective  Well appearing patient in no apparent distress; mood and affect are within normal limits.  A focused examination was performed including left thigh. Relevant physical exam findings are noted in the Assessment and Plan.  Objective  Left Thigh - Anterior: 1.2 cm healing biopsy site    Assessment & Plan  Dysplastic nevus Left Thigh - Anterior  Biopsy proven moderate-severe dysplastic nevus   Epidermal / dermal shaving - Left Thigh - Anterior  Lesion diameter (cm):  1.2 Informed consent: discussed and consent obtained   Timeout: patient name, date of birth, surgical site, and procedure verified   Procedure prep:  Patient was prepped and draped in usual sterile fashion Prep type:  Isopropyl alcohol Anesthesia: the lesion was anesthetized in a standard fashion   Anesthetic:  1% lidocaine w/ epinephrine 1-100,000 buffered w/ 8.4% NaHCO3 Instrument used: flexible razor blade   Hemostasis achieved with: pressure, aluminum chloride and electrodesiccation   Outcome: patient tolerated procedure well   Post-procedure details: sterile dressing applied and wound care instructions given   Dressing type: bandage and petrolatum    Specimen 1 - Surgical pathology Differential Diagnosis: Biopsy proven moderate-severe dysplastic nevus Check Margins: Yes 1.2 cm healing biopsy site [phone]  Return for Follow up as scheduled, TBSE.   I, Ashok Cordia, CMA, am acting as scribe for [name], MD  .  Documentation: I have reviewed the above documentation for accuracy and completeness, and I agree with the above.  [name], MD

## 2019-11-29 NOTE — Patient Instructions (Signed)

## 2019-11-30 ENCOUNTER — Encounter: Payer: Self-pay | Admitting: Dermatology

## 2019-12-05 ENCOUNTER — Telehealth: Payer: Self-pay

## 2019-12-05 NOTE — Telephone Encounter (Signed)
Left message for patient to call office for results/hd 

## 2019-12-05 NOTE — Telephone Encounter (Signed)
-----   Message from Ralene Bathe, MD sent at 12/04/2019  6:47 PM EDT ----- Diagnosis Skin , left thigh anterior NO RESIDUAL DYSPLASTIC NEVUS, MARGINS FREE  Moderate to Severe dysplastic Margins free

## 2019-12-06 NOTE — Telephone Encounter (Signed)
Patient advised of biopsy results.

## 2020-04-09 ENCOUNTER — Other Ambulatory Visit: Payer: Self-pay

## 2020-04-09 ENCOUNTER — Ambulatory Visit
Admission: RE | Admit: 2020-04-09 | Discharge: 2020-04-09 | Disposition: A | Discharge status: Home / Self Care | Payer: Medicare HMO | Source: Ambulatory Visit | Attending: Obstetrics and Gynecology | Admitting: Obstetrics and Gynecology

## 2020-04-09 DIAGNOSIS — Z1231 Encounter for screening mammogram for malignant neoplasm of breast: Secondary | ICD-10-CM | POA: Insufficient documentation

## 2020-04-10 ENCOUNTER — Encounter: Payer: Self-pay | Admitting: Obstetrics and Gynecology

## 2020-06-07 ENCOUNTER — Other Ambulatory Visit: Payer: Self-pay

## 2020-06-07 ENCOUNTER — Ambulatory Visit (INDEPENDENT_AMBULATORY_CARE_PROVIDER_SITE_OTHER): Payer: Self-pay | Admitting: Dermatology

## 2020-06-07 DIAGNOSIS — L988 Other specified disorders of the skin and subcutaneous tissue: Secondary | ICD-10-CM

## 2020-06-07 NOTE — Progress Notes (Signed)
   Follow-Up Visit   Subjective  Maria Krueger is a 75 y.o. female who presents for the following: Facial Elastosis Maria Krueger consultation and treatment today).  The following portions of the chart were reviewed this encounter and updated as appropriate:   Tobacco  Allergies  Meds  Problems  Med Hx  Surg Hx  Fam Hx     Review of Systems:  No other skin or systemic complaints except as noted in HPI or Assessment and Plan.  Objective  Well appearing patient in no apparent distress; mood and affect are within normal limits.  A focused examination was performed including face. Relevant physical exam findings are noted in the Assessment and Plan.  Objective  Oral commisssure areas/marionette lines: Rhytides and volume loss.    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 Patient is going out of town this weekend so she would like to reschedule due to risk of bruising. Will plan 1 syringe of Restylane Defyne to oral commissure area/marionette lines on follow up. Advised patient more can be added the same day or can wait a few weeks to see how it settles in and add more later if needed.  Return for Follow up as scheduled, Fillers.  I, Ashok Cordia, CMA, am acting as scribe for [name], MD .  Documentation: I have reviewed the above documentation for accuracy and completeness, and I agree with the above.  [name], MD

## 2020-06-11 ENCOUNTER — Encounter: Payer: Self-pay | Admitting: Dermatology

## 2020-06-13 ENCOUNTER — Other Ambulatory Visit: Payer: Self-pay

## 2020-06-13 ENCOUNTER — Ambulatory Visit (INDEPENDENT_AMBULATORY_CARE_PROVIDER_SITE_OTHER): Payer: Medicare HMO | Admitting: Dermatology

## 2020-06-13 DIAGNOSIS — L988 Other specified disorders of the skin and subcutaneous tissue: Secondary | ICD-10-CM

## 2020-06-13 NOTE — Patient Instructions (Signed)

## 2020-06-13 NOTE — Progress Notes (Signed)
   Follow-Up Visit   Subjective  Maria Krueger is a 75 y.o. female who presents for the following: Facial Elastosis (Patient is here today for facial fillers).  The following portions of the chart were reviewed this encounter and updated as appropriate:   Tobacco  Allergies  Meds  Problems  Med Hx  Surg Hx  Fam Hx     Review of Systems:  No other skin or systemic complaints except as noted in HPI or Assessment and Plan.  Objective  Well appearing patient in no apparent distress; mood and affect are within normal limits.  A focused examination was performed including the face . Relevant physical exam findings are noted in the Assessment and Plan.  Objective  Face: Rhytides and volume loss.   Images                          Assessment & Plan  Elastosis of skin Face  Restylane Defyne 1 cc injected into: - B/L marionette lines - B/L oral commisures  Filling material injection - Face Prior to the procedure, the patient's past medical history, allergies and the rare but potential risks and complications were reviewed with the patient and a signed consent was obtained. Pre and post-treatment care was discussed and instructions provided.  Location: perioral and marionette lines  Filler Type: Restylane defyne  Lot Number: [id number]  Expiration Date: 10/03/2021 Procedure: The area was prepped thoroughly with Puracyn. After introducing the needle into the desired treatment area, the syringe plunger was drawn back to ensure there was no flash of blood prior to injecting the filler in order to minimize risk of intravascular injection and vascular occlusion. After injection of the filler, the treated areas were cleansed and iced to reduce swelling. Post-treatment instructions were reviewed with the patient.       Patient tolerated the procedure well. The patient will call with any problems, questions or concerns prior to their next appointment.   Return if  symptoms worsen or fail to improve.  Luther Redo, CMA, am acting as scribe for [name], MD .  Documentation: I have reviewed the above documentation for accuracy and completeness, and I agree with the above.  [name], MD

## 2020-06-19 ENCOUNTER — Encounter: Payer: Self-pay | Admitting: Dermatology

## 2020-07-16 ENCOUNTER — Telehealth: Payer: Self-pay

## 2020-07-16 DIAGNOSIS — Z1231 Encounter for screening mammogram for malignant neoplasm of breast: Secondary | ICD-10-CM

## 2020-07-16 NOTE — Telephone Encounter (Signed)
Pt calling; recv'd letter stating she need to schedule an annual exam; she does an annual exam every other year d/t ins; has mammogram done every year.  Annual was last done in Aug of last year and mammogram is due in Jan/Feb but will need order.  [phone]

## 2020-07-17 NOTE — Telephone Encounter (Signed)
Not sure why pt got letter because she is not due for annual till next yr or never if that is her preference, and that is in her note. I did put mammo order in so she can schedule for 3/23. Thx

## 2020-07-18 NOTE — Telephone Encounter (Signed)
Pt aware.

## 2020-08-01 ENCOUNTER — Ambulatory Visit: Payer: Medicare HMO | Admitting: Dermatology

## 2020-08-30 ENCOUNTER — Ambulatory Visit: Payer: Medicare HMO | Admitting: Dermatology

## 2020-09-17 ENCOUNTER — Ambulatory Visit: Payer: Medicare HMO | Admitting: Dermatology

## 2020-09-17 ENCOUNTER — Other Ambulatory Visit: Payer: Self-pay

## 2020-09-17 DIAGNOSIS — Z85828 Personal history of other malignant neoplasm of skin: Secondary | ICD-10-CM

## 2020-09-17 DIAGNOSIS — L820 Inflamed seborrheic keratosis: Secondary | ICD-10-CM | POA: Diagnosis not present

## 2020-09-17 DIAGNOSIS — D1800 Hemangioma unspecified site: Secondary | ICD-10-CM

## 2020-09-17 DIAGNOSIS — L578 Other skin changes due to chronic exposure to nonionizing radiation: Secondary | ICD-10-CM

## 2020-09-17 DIAGNOSIS — Z1283 Encounter for screening for malignant neoplasm of skin: Secondary | ICD-10-CM

## 2020-09-17 DIAGNOSIS — L821 Other seborrheic keratosis: Secondary | ICD-10-CM

## 2020-09-17 DIAGNOSIS — L814 Other melanin hyperpigmentation: Secondary | ICD-10-CM

## 2020-09-17 DIAGNOSIS — D229 Melanocytic nevi, unspecified: Secondary | ICD-10-CM

## 2020-09-17 DIAGNOSIS — Z86018 Personal history of other benign neoplasm: Secondary | ICD-10-CM

## 2020-09-17 NOTE — Patient Instructions (Signed)

## 2020-09-17 NOTE — Progress Notes (Signed)
   Follow-Up Visit   Subjective  Maria Krueger is a 75 y.o. female who presents for the following: Annual Exam (History of dysplastic nevi - TBSE today). The patient presents for Total-Body Skin Exam (TBSE) for skin cancer screening and mole check.  The following portions of the chart were reviewed this encounter and updated as appropriate:   Tobacco  Allergies  Meds  Problems  Med Hx  Surg Hx  Fam Hx     Review of Systems:  No other skin or systemic complaints except as noted in HPI or Assessment and Plan.  Objective  Well appearing patient in no apparent distress; mood and affect are within normal limits.  A full examination was performed including scalp, head, eyes, ears, nose, lips, neck, chest, axillae, abdomen, back, buttocks, bilateral upper extremities, bilateral lower extremities, hands, feet, fingers, toes, fingernails, and toenails. All findings within normal limits unless otherwise noted below.  Dorsum of Nose Well healed excision site  Crown Erythematous keratotic or waxy stuck-on papule or plaque.    Assessment & Plan   Lentigines - Scattered tan macules - Due to sun exposure - Benign-appering, observe - Recommend daily broad spectrum sunscreen SPF 30+ to sun-exposed areas, reapply every 2 hours as needed. - Call for any changes  Seborrheic Keratoses - Stuck-on, waxy, tan-brown papules and/or plaques  - Benign-appearing - Discussed benign etiology and prognosis. - Observe - Call for any changes  Melanocytic Nevi - Tan-brown and/or pink-flesh-colored symmetric macules and papules - Benign appearing on exam today - Observation - Call clinic for new or changing moles - Recommend daily use of broad spectrum spf 30+ sunscreen to sun-exposed areas.   Hemangiomas - Red papules - Discussed benign nature - Observe - Call for any changes  Actinic Damage - Chronic condition, secondary to cumulative UV/sun exposure - diffuse scaly erythematous macules  with underlying dyspigmentation - Recommend daily broad spectrum sunscreen SPF 30+ to sun-exposed areas, reapply every 2 hours as needed.  - Staying in the shade or wearing long sleeves, sun glasses (UVA+UVB protection) and wide brim hats (4-inch brim around the entire circumference of the hat) are also recommended for sun protection.  - Call for new or changing lesions.  Skin cancer screening performed today.  History of Dysplastic Nevi - No evidence of recurrence today - Recommend regular full body skin exams - Recommend daily broad spectrum sunscreen SPF 30+ to sun-exposed areas, reapply every 2 hours as needed.  - Call if any new or changing lesions are noted between office visits  History of basal cell carcinoma (BCC) Dorsum of Nose Clear. Observe for recurrence. Call clinic for new or changing lesions.  Recommend regular skin exams, daily broad-spectrum spf 30+ sunscreen use, and photoprotection.    Inflamed seborrheic keratosis Crown scalp Destruction of lesion - Crown scalp Complexity: simple   Destruction method: cryotherapy   Informed consent: discussed and consent obtained   Timeout:  patient name, date of birth, surgical site, and procedure verified Lesion destroyed using liquid nitrogen: Yes   Region frozen until ice ball extended beyond lesion: Yes   Outcome: patient tolerated procedure well with no complications   Post-procedure details: wound care instructions given    Skin cancer screening  Return in about 1 year (around 09/17/2021) for TBSE.  I, Ashok Cordia, CMA, am acting as scribe for [name], MD . Documentation: I have reviewed the above documentation for accuracy and completeness, and I agree with the above.  [name], MD

## 2020-09-19 ENCOUNTER — Encounter: Payer: Self-pay | Admitting: Dermatology

## 2020-10-25 ENCOUNTER — Ambulatory Visit: Payer: Medicare HMO | Admitting: Dermatology

## 2020-11-05 ENCOUNTER — Telehealth: Payer: Self-pay

## 2020-11-05 NOTE — Telephone Encounter (Signed)
Pt calling; pt of [facility]; was told year before last she didn't have to come back; needs referral for mammogram.  [phone]

## 2020-11-06 ENCOUNTER — Other Ambulatory Visit: Payer: Self-pay | Admitting: Obstetrics and Gynecology

## 2020-11-06 DIAGNOSIS — Z1231 Encounter for screening mammogram for malignant neoplasm of breast: Secondary | ICD-10-CM

## 2020-11-06 NOTE — Telephone Encounter (Signed)
Called pt back, not available, left msg to call back.

## 2020-11-06 NOTE — Telephone Encounter (Signed)
Order placed but not due till 3/23.

## 2020-11-06 NOTE — Telephone Encounter (Signed)
Patient is aware 

## 2020-11-06 NOTE — Progress Notes (Signed)
Mammo order for 3/23

## 2021-01-03 DEATH — deceased

## 2021-04-26 ENCOUNTER — Other Ambulatory Visit: Payer: Self-pay

## 2021-04-26 ENCOUNTER — Ambulatory Visit
Admission: RE | Admit: 2021-04-26 | Discharge: 2021-04-26 | Disposition: A | Discharge status: Home / Self Care | Payer: Medicare PPO | Source: Ambulatory Visit | Attending: Obstetrics and Gynecology | Admitting: Obstetrics and Gynecology

## 2021-04-26 DIAGNOSIS — Z1231 Encounter for screening mammogram for malignant neoplasm of breast: Secondary | ICD-10-CM | POA: Diagnosis present

## 2021-07-17 ENCOUNTER — Ambulatory Visit: Payer: Medicare PPO | Admitting: Dermatology

## 2021-09-18 ENCOUNTER — Ambulatory Visit: Payer: Medicare PPO | Admitting: Dermatology

## 2021-09-18 DIAGNOSIS — D229 Melanocytic nevi, unspecified: Secondary | ICD-10-CM

## 2021-09-18 DIAGNOSIS — L570 Actinic keratosis: Secondary | ICD-10-CM | POA: Diagnosis not present

## 2021-09-18 DIAGNOSIS — L578 Other skin changes due to chronic exposure to nonionizing radiation: Secondary | ICD-10-CM | POA: Diagnosis not present

## 2021-09-18 DIAGNOSIS — D1801 Hemangioma of skin and subcutaneous tissue: Secondary | ICD-10-CM

## 2021-09-18 DIAGNOSIS — Z1283 Encounter for screening for malignant neoplasm of skin: Secondary | ICD-10-CM | POA: Diagnosis not present

## 2021-09-18 DIAGNOSIS — D1800 Hemangioma unspecified site: Secondary | ICD-10-CM

## 2021-09-18 DIAGNOSIS — L821 Other seborrheic keratosis: Secondary | ICD-10-CM | POA: Diagnosis not present

## 2021-09-18 DIAGNOSIS — L814 Other melanin hyperpigmentation: Secondary | ICD-10-CM | POA: Diagnosis not present

## 2021-09-18 DIAGNOSIS — Z86018 Personal history of other benign neoplasm: Secondary | ICD-10-CM

## 2021-09-18 NOTE — Progress Notes (Signed)
Follow-Up Visit   Subjective  Maria Krueger is a 76 y.o. female who presents for the following: Total body skin exam (Hx of Dysplastic nvi), check spot (L face, has used husbands TMC 0.1% oint), and check red spots (Arms, pt would like txted with LN2). The patient presents for Total-Body Skin Exam (TBSE) for skin cancer screening and mole check.  The patient has spots, moles and lesions to be evaluated, some may be new or changing and the patient has concerns that these could be cancer.   The following portions of the chart were reviewed this encounter and updated as appropriate:   Tobacco  Allergies  Meds  Problems  Med Hx  Surg Hx  Fam Hx     Review of Systems:  No other skin or systemic complaints except as noted in HPI or Assessment and Plan.  Objective  Well appearing patient in no apparent distress; mood and affect are within normal limits.  A full examination was performed including scalp, head, eyes, ears, nose, lips, neck, chest, axillae, abdomen, back, buttocks, bilateral upper extremities, bilateral lower extremities, hands, feet, fingers, toes, fingernails, and toenails. All findings within normal limits unless otherwise noted below.  L cheek x 1 Pink scaly macules  L arm x 2, R arm x 2, L chest x 1 (5) Red paps   Assessment & Plan   Lentigines - Scattered tan macules - Due to sun exposure - Benign-appearing, observe - Recommend daily broad spectrum sunscreen SPF 30+ to sun-exposed areas, reapply every 2 hours as needed. - Call for any changes  Seborrheic Keratoses - Stuck-on, waxy, tan-brown papules and/or plaques  - Benign-appearing - Discussed benign etiology and prognosis. - Observe - Call for any changes  Melanocytic Nevi - Tan-brown and/or pink-flesh-colored symmetric macules and papules - Benign appearing on exam today - Observation - Call clinic for new or changing moles - Recommend daily use of broad spectrum spf 30+ sunscreen to sun-exposed  areas.   Hemangiomas - Red papules - Discussed benign nature - Observe - Call for any changes  Actinic Damage - Chronic condition, secondary to cumulative UV/sun exposure - diffuse scaly erythematous macules with underlying dyspigmentation - Recommend daily broad spectrum sunscreen SPF 30+ to sun-exposed areas, reapply every 2 hours as needed.  - Staying in the shade or wearing long sleeves, sun glasses (UVA+UVB protection) and wide brim hats (4-inch brim around the entire circumference of the hat) are also recommended for sun protection.  - Call for new or changing lesions.  Skin cancer screening performed today.   History of Dysplastic Nevi - No evidence of recurrence today - Recommend regular full body skin exams - Recommend daily broad spectrum sunscreen SPF 30+ to sun-exposed areas, reapply every 2 hours as needed.  - Call if any new or changing lesions are noted between office visits  - L post lat thigh, L thigh, L thigh anterior inferior  AK (actinic keratosis) L cheek x 1  Destruction of lesion - L cheek x 1 Complexity: simple   Destruction method: cryotherapy   Informed consent: discussed and consent obtained   Timeout:  patient name, date of birth, surgical site, and procedure verified Lesion destroyed using liquid nitrogen: Yes   Region frozen until ice ball extended beyond lesion: Yes   Outcome: patient tolerated procedure well with no complications   Post-procedure details: wound care instructions given    Hemangioma of skin (5) L arm x 2, R arm x 2, L chest x  1  Discussed cosmetic procedure, noncovered.  $60 for 1st lesion and $15 for each additional lesion if done on the same day.  Maximum charge $350.  One touch-up treatment included no charge. Discussed risks of treatment including dyspigmentation, small scar, and/or recurrence. Recommend daily broad spectrum sunscreen SPF 30+/photoprotection to treated areas once healed.   Destruction of lesion - L arm x 2,  R arm x 2, L chest x 1 Complexity: simple   Destruction method comment:  Electrodesiccation Informed consent: discussed and consent obtained   Timeout:  patient name, date of birth, surgical site, and procedure verified Patient was prepped and draped in usual sterile fashion: patient was prepped with isopropyl alcohol. Anesthesia: the lesion was anesthetized in a standard fashion   Anesthetic:  1% lidocaine w/ epinephrine 1-100,000 local infiltration Outcome: patient tolerated procedure well with no complications    Skin cancer screening  Actinic skin damage   Return in about 1 year (around 09/19/2022) for TBSE, Hx of Dysplastic nevi.  I, Maria Krueger, RMA, am acting as scribe for Maria Ser, MD . Documentation: I have reviewed the above documentation for accuracy and completeness, and I agree with the above.  Maria Ser, MD

## 2021-09-18 NOTE — Patient Instructions (Addendum)
Cryotherapy Aftercare  Wash gently with soap and water everyday.   Apply Vaseline and Band-Aid daily until healed.     Due to recent changes in healthcare laws, you may see results of your pathology and/or laboratory studies on MyChart before the doctors have had a chance to review them. We understand that in some cases there may be results that are confusing or concerning to you. Please understand that not all results are received at the same time and often the doctors may need to interpret multiple results in order to provide you with the best plan of care or course of treatment. Therefore, we ask that you please give us 2 business days to thoroughly review all your results before contacting the office for clarification. Should we see a critical lab result, you will be contacted sooner.   If You Need Anything After Your Visit  If you have any questions or concerns for your doctor, please call our main line at [phone] and press option 4 to reach your doctor's medical assistant. If no one answers, please leave a voicemail as directed and we will return your call as soon as possible. Messages left after 4 pm will be answered the following business day.   You may also send us a message via MyChart. We typically respond to MyChart messages within 1-2 business days.  For prescription refills, please ask your pharmacy to contact our office. Our fax number is [phone].  If you have an urgent issue when the clinic is closed that cannot wait until the next business day, you can page your doctor at the number below.    Please note that while we do our best to be available for urgent issues outside of office hours, we are not available 24/7.   If you have an urgent issue and are unable to reach us, you may choose to seek medical care at your doctor's office, retail clinic, urgent care center, or emergency room.  If you have a medical emergency, please immediately call 911 or go to the  emergency department.  Pager Numbers  - Dr. Kowalski: [phone]  - Dr. Moye: [phone]  - Dr. Stewart: [phone]  In the event of inclement weather, please call our main line at [phone] for an update on the status of any delays or closures.  Dermatology Medication Tips: Please keep the boxes that topical medications come in in order to help keep track of the instructions about where and how to use these. Pharmacies typically print the medication instructions only on the boxes and not directly on the medication tubes.   If your medication is too expensive, please contact our office at [phone] option 4 or send us a message through MyChart.   We are unable to tell what your co-pay for medications will be in advance as this is different depending on your insurance coverage. However, we may be able to find a substitute medication at lower cost or fill out paperwork to get insurance to cover a needed medication.   If a prior authorization is required to get your medication covered by your insurance company, please allow us 1-2 business days to complete this process.  Drug prices often vary depending on where the prescription is filled and some pharmacies may offer cheaper prices.  The website www.goodrx.com contains coupons for medications through different pharmacies. The prices here do not account for what the cost may be with help from insurance (it may be cheaper with your insurance), but the website can   give you the price if you did not use any insurance.  - You can print the associated coupon and take it with your prescription to the pharmacy.  - You may also stop by our office during regular business hours and pick up a GoodRx coupon card.  - If you need your prescription sent electronically to a different pharmacy, notify our office through [facility] MyChart or by phone at [phone] option 4.     Si Usted Necesita Algo Despus de Su Visita  Tambin puede  enviarnos un mensaje a travs de MyChart. Por lo general respondemos a los mensajes de MyChart en el transcurso de 1 a 2 das hbiles.  Para renovar recetas, por favor pida a su farmacia que se ponga en contacto con nuestra oficina. Nuestro nmero de fax es el [phone].  Si tiene un asunto urgente cuando la clnica est cerrada y que no puede esperar hasta el siguiente da hbil, puede llamar/localizar a su doctor(a) al nmero que aparece a continuacin.   Por favor, tenga en cuenta que aunque hacemos todo lo posible para estar disponibles para asuntos urgentes fuera del horario de oficina, no estamos disponibles las 24 horas del da, los 7 das de la semana.   Si tiene un problema urgente y no puede comunicarse con nosotros, puede optar por buscar atencin mdica  en el consultorio de su doctor(a), en una clnica privada, en un centro de atencin urgente o en una sala de emergencias.  Si tiene una emergencia mdica, por favor llame inmediatamente al 911 o vaya a la sala de emergencias.  Nmeros de bper  - Dr. Kowalski: [phone]  - Dra. Moye: [phone]  - Dra. Stewart: [phone]  En caso de inclemencias del tiempo, por favor llame a nuestra lnea principal al [phone] para una actualizacin sobre el estado de cualquier retraso o cierre.  Consejos para la medicacin en dermatologa: Por favor, guarde las cajas en las que vienen los medicamentos de uso tpico para ayudarle a seguir las instrucciones sobre dnde y cmo usarlos. Las farmacias generalmente imprimen las instrucciones del medicamento slo en las cajas y no directamente en los tubos del medicamento.   Si su medicamento es muy caro, por favor, pngase en contacto con nuestra oficina llamando al [phone] y presione la opcin 4 o envenos un mensaje a travs de MyChart.   No podemos decirle cul ser su copago por los medicamentos por adelantado ya que esto es diferente dependiendo de la cobertura de su seguro.  Sin embargo, es posible que podamos encontrar un medicamento sustituto a menor costo o llenar un formulario para que el seguro cubra el medicamento que se considera necesario.   Si se requiere una autorizacin previa para que su compaa de seguros cubra su medicamento, por favor permtanos de 1 a 2 das hbiles para completar este proceso.  Los precios de los medicamentos varan con frecuencia dependiendo del lugar de dnde se surte la receta y alguna farmacias pueden ofrecer precios ms baratos.  El sitio web www.goodrx.com tiene cupones para medicamentos de diferentes farmacias. Los precios aqu no tienen en cuenta lo que podra costar con la ayuda del seguro (puede ser ms barato con su seguro), pero el sitio web puede darle el precio si no utiliz ningn seguro.  - Puede imprimir el cupn correspondiente y llevarlo con su receta a la farmacia.  - Tambin puede pasar por nuestra oficina durante el horario de atencin regular y recoger una tarjeta de cupones de GoodRx.  -   Si necesita que su receta se enve electrnicamente a una farmacia diferente, informe a nuestra oficina a travs de MyChart de [place] o por telfono llamando al [phone] y presione la opcin 4.  

## 2021-09-24 ENCOUNTER — Encounter: Payer: Self-pay | Admitting: Dermatology

## 2021-11-05 ENCOUNTER — Ambulatory Visit: Payer: Medicare PPO | Admitting: Dermatology

## 2021-11-05 ENCOUNTER — Encounter: Payer: Self-pay | Admitting: Dermatology

## 2021-11-05 DIAGNOSIS — L719 Rosacea, unspecified: Secondary | ICD-10-CM

## 2021-11-05 DIAGNOSIS — Z85828 Personal history of other malignant neoplasm of skin: Secondary | ICD-10-CM

## 2021-11-05 NOTE — Patient Instructions (Addendum)
Recommend daily broad spectrum sunscreen SPF 30+ to sun-exposed areas, reapply every 2 hours as needed. Call for new or changing lesions.  Staying in the shade or wearing long sleeves, sun glasses (UVA+UVB protection) and wide brim hats (4-inch brim around the entire circumference of the hat) are also recommended for sun protection.        Due to recent changes in healthcare laws, you may see results of your pathology and/or laboratory studies on MyChart before the doctors have had a chance to review them. We understand that in some cases there may be results that are confusing or concerning to you. Please understand that not all results are received at the same time and often the doctors may need to interpret multiple results in order to provide you with the best plan of care or course of treatment. Therefore, we ask that you please give us 2 business days to thoroughly review all your results before contacting the office for clarification. Should we see a critical lab result, you will be contacted sooner.   If You Need Anything After Your Visit  If you have any questions or concerns for your doctor, please call our main line at [phone] and press option 4 to reach your doctor's medical assistant. If no one answers, please leave a voicemail as directed and we will return your call as soon as possible. Messages left after 4 pm will be answered the following business day.   You may also send us a message via MyChart. We typically respond to MyChart messages within 1-2 business days.  For prescription refills, please ask your pharmacy to contact our office. Our fax number is [phone].  If you have an urgent issue when the clinic is closed that cannot wait until the next business day, you can page your doctor at the number below.    Please note that while we do our best to be available for urgent issues outside of office hours, we are not available 24/7.   If you have an urgent issue and  are unable to reach us, you may choose to seek medical care at your doctor's office, retail clinic, urgent care center, or emergency room.  If you have a medical emergency, please immediately call 911 or go to the emergency department.  Pager Numbers  - Dr. Kowalski: [phone]  - Dr. Moye: [phone]  - Dr. Stewart: [phone]  In the event of inclement weather, please call our main line at [phone] for an update on the status of any delays or closures.  Dermatology Medication Tips: Please keep the boxes that topical medications come in in order to help keep track of the instructions about where and how to use these. Pharmacies typically print the medication instructions only on the boxes and not directly on the medication tubes.   If your medication is too expensive, please contact our office at [phone] option 4 or send us a message through MyChart.   We are unable to tell what your co-pay for medications will be in advance as this is different depending on your insurance coverage. However, we may be able to find a substitute medication at lower cost or fill out paperwork to get insurance to cover a needed medication.   If a prior authorization is required to get your medication covered by your insurance company, please allow us 1-2 business days to complete this process.  Drug prices often vary depending on where the prescription is filled and some pharmacies may offer cheaper prices.    website www.goodrx.com contains coupons for medications through different pharmacies. The prices here do not account for what the cost may be with help from insurance (it may be cheaper with your insurance), but the website can give you the price if you did not use any insurance.  - You can print the associated coupon and take it with your prescription to the pharmacy.  - You may also stop by our office during regular business hours and pick up a GoodRx coupon card.  - If you need your  prescription sent electronically to a different pharmacy, notify our office through [facility] MyChart or by phone at [phone] option 4.     Si Usted Necesita Algo Despus de Su Visita  Tambin puede enviarnos un mensaje a travs de MyChart. Por lo general respondemos a los mensajes de MyChart en el transcurso de 1 a 2 das hbiles.  Para renovar recetas, por favor pida a su farmacia que se ponga en contacto con nuestra oficina. Nuestro nmero de fax es el [phone].  Si tiene un asunto urgente cuando la clnica est cerrada y que no puede esperar hasta el siguiente da hbil, puede llamar/localizar a su doctor(a) al nmero que aparece a continuacin.   Por favor, tenga en cuenta que aunque hacemos todo lo posible para estar disponibles para asuntos urgentes fuera del horario de oficina, no estamos disponibles las 24 horas del da, los 7 das de la semana.   Si tiene un problema urgente y no puede comunicarse con nosotros, puede optar por buscar atencin mdica  en el consultorio de su doctor(a), en una clnica privada, en un centro de atencin urgente o en una sala de emergencias.  Si tiene una emergencia mdica, por favor llame inmediatamente al 911 o vaya a la sala de emergencias.  Nmeros de bper  - Dr. Kowalski: [phone]  - Dra. Moye: [phone]  - Dra. Stewart: [phone]  En caso de inclemencias del tiempo, por favor llame a nuestra lnea principal al [phone] para una actualizacin sobre el estado de cualquier retraso o cierre.  Consejos para la medicacin en dermatologa: Por favor, guarde las cajas en las que vienen los medicamentos de uso tpico para ayudarle a seguir las instrucciones sobre dnde y cmo usarlos. Las farmacias generalmente imprimen las instrucciones del medicamento slo en las cajas y no directamente en los tubos del medicamento.   Si su medicamento es muy caro, por favor, pngase en contacto con nuestra oficina llamando al [phone]  y presione la opcin 4 o envenos un mensaje a travs de MyChart.   No podemos decirle cul ser su copago por los medicamentos por adelantado ya que esto es diferente dependiendo de la cobertura de su seguro. Sin embargo, es posible que podamos encontrar un medicamento sustituto a menor costo o llenar un formulario para que el seguro cubra el medicamento que se considera necesario.   Si se requiere una autorizacin previa para que su compaa de seguros cubra su medicamento, por favor permtanos de 1 a 2 das hbiles para completar este proceso.  Los precios de los medicamentos varan con frecuencia dependiendo del lugar de dnde se surte la receta y alguna farmacias pueden ofrecer precios ms baratos.  El sitio web www.goodrx.com tiene cupones para medicamentos de diferentes farmacias. Los precios aqu no tienen en cuenta lo que podra costar con la ayuda del seguro (puede ser ms barato con su seguro), pero el sitio web puede darle el precio si no utiliz ningn seguro.  - Puede   imprimir el cupn correspondiente y llevarlo con su receta a la farmacia.  - Tambin puede pasar por nuestra oficina durante el horario de atencin regular y [employer] una tarjeta de cupones de GoodRx.  - Si necesita que su receta se enve electrnicamente a una farmacia diferente, informe a nuestra oficina a travs de MyChart de [facility] o por telfono llamando al [phone] y presione la opcin 4.

## 2021-11-05 NOTE — Progress Notes (Signed)
   Follow-Up Visit   Subjective  Maria Krueger is a 75 y.o. female who presents for the following: Skin Problem (Check a new scaly spot on the right tip of nose, hx of superficial BCC in 2017 in this area. ).  The following portions of the chart were reviewed this encounter and updated as appropriate:   Tobacco  Allergies  Meds  Problems  Med Hx  Surg Hx  Fam Hx     Review of Systems:  No other skin or systemic complaints except as noted in HPI or Assessment and Plan.  Objective  Well appearing patient in no apparent distress; mood and affect are within normal limits.  A focused examination was performed including face,nose. Relevant physical exam findings are noted in the Assessment and Plan.  Nose Mid face erythema with telangiectasias +/- small papules.   Assessment & Plan  Rosacea Nose  Rosacea is a chronic progressive skin condition usually affecting the face of adults, causing redness and/or acne bumps. It is treatable but not curable. It sometimes affects the eyes (ocular rosacea) as well. It may respond to topical and/or systemic medication and can flare with stress, sun exposure, alcohol, exercise, topical steroids (including hydrocortisone/cortisone 10) and some foods.  Daily application of broad spectrum spf 30+ sunscreen to face is recommended to reduce flares.   Patient deferred prescription treatment at this time.  No signs of skin cancer appreciated on dermoscopy today  History of Basal Cell Carcinoma of the Skin Nasal tip 2007 - No evidence of recurrence today - Recommend regular full body skin exams - Recommend daily broad spectrum sunscreen SPF 30+ to sun-exposed areas, reapply every 2 hours as needed.  - Call if any new or changing lesions are noted between office visits   Return for as scheduled for TBSE .  I, Marye Round, CMA, am acting as scribe for Forest Gleason, MD .   Documentation: I have reviewed the above documentation for accuracy and  completeness, and I agree with the above.  Forest Gleason, MD

## 2021-11-18 ENCOUNTER — Encounter: Payer: Self-pay | Admitting: Dermatology

## 2022-03-27 ENCOUNTER — Other Ambulatory Visit: Payer: Self-pay | Admitting: Family Medicine

## 2022-03-27 DIAGNOSIS — Z1231 Encounter for screening mammogram for malignant neoplasm of breast: Secondary | ICD-10-CM

## 2022-04-28 ENCOUNTER — Ambulatory Visit
Admission: RE | Admit: 2022-04-28 | Discharge: 2022-04-28 | Disposition: A | Discharge status: Home / Self Care | Payer: Medicare HMO | Source: Ambulatory Visit | Attending: Family Medicine | Admitting: Family Medicine

## 2022-04-28 DIAGNOSIS — Z1231 Encounter for screening mammogram for malignant neoplasm of breast: Secondary | ICD-10-CM | POA: Diagnosis present

## 2022-10-09 ENCOUNTER — Ambulatory Visit: Payer: Medicare HMO | Admitting: Dermatology

## 2022-10-09 VITALS — BP 113/71 | HR 77

## 2022-10-09 DIAGNOSIS — Z1283 Encounter for screening for malignant neoplasm of skin: Secondary | ICD-10-CM

## 2022-10-09 DIAGNOSIS — L578 Other skin changes due to chronic exposure to nonionizing radiation: Secondary | ICD-10-CM

## 2022-10-09 DIAGNOSIS — W908XXA Exposure to other nonionizing radiation, initial encounter: Secondary | ICD-10-CM

## 2022-10-09 DIAGNOSIS — L719 Rosacea, unspecified: Secondary | ICD-10-CM | POA: Diagnosis not present

## 2022-10-09 DIAGNOSIS — L988 Other specified disorders of the skin and subcutaneous tissue: Secondary | ICD-10-CM

## 2022-10-09 DIAGNOSIS — L814 Other melanin hyperpigmentation: Secondary | ICD-10-CM

## 2022-10-09 DIAGNOSIS — Z85828 Personal history of other malignant neoplasm of skin: Secondary | ICD-10-CM

## 2022-10-09 DIAGNOSIS — Z79899 Other long term (current) drug therapy: Secondary | ICD-10-CM

## 2022-10-09 DIAGNOSIS — D1801 Hemangioma of skin and subcutaneous tissue: Secondary | ICD-10-CM

## 2022-10-09 DIAGNOSIS — L821 Other seborrheic keratosis: Secondary | ICD-10-CM

## 2022-10-09 DIAGNOSIS — Z7189 Other specified counseling: Secondary | ICD-10-CM

## 2022-10-09 DIAGNOSIS — D229 Melanocytic nevi, unspecified: Secondary | ICD-10-CM

## 2022-10-09 DIAGNOSIS — Z86018 Personal history of other benign neoplasm: Secondary | ICD-10-CM

## 2022-10-09 MED ORDER — SAFETY SEAL MISCELLANEOUS MISC: 0 refills | Status: AC

## 2022-10-09 NOTE — Patient Instructions (Addendum)
Recommend Alastin body transform daily then applying CeraVe cream for the arms.   Recommend Alastin restorative skin complex daily on the face.  Due to recent changes in healthcare laws, you may see results of your pathology and/or laboratory studies on MyChart before the doctors have had a chance to review them. We understand that in some cases there may be results that are confusing or concerning to you. Please understand that not all results are received at the same time and often the doctors may need to interpret multiple results in order to provide you with the best plan of care or course of treatment. Therefore, we ask that you please give [place] 2 business days to thoroughly review all your results before contacting the office for clarification. Should we see a critical lab result, you will be contacted sooner.   If You Need Anything After Your Visit  If you have any questions or concerns for your doctor, please call our main line at [phone] and press option 4 to reach your doctor's medical assistant. If no one answers, please leave a voicemail as directed and we will return your call as soon as possible. Messages left after 4 pm will be answered the following business day.   You may also send [place] a message via MyChart. We typically respond to MyChart messages within 1-2 business days.  For prescription refills, please ask your pharmacy to contact our office. Our fax number is [phone].  If you have an urgent issue when the clinic is closed that cannot wait until the next business day, you can page your doctor at the number below.    Please note that while we do our best to be available for urgent issues outside of office hours, we are not available 24/7.   If you have an urgent issue and are unable to reach [place], you may choose to seek medical care at your doctor's office, retail clinic, urgent care center, or emergency room.  If you have a medical emergency, please immediately  call 911 or go to the emergency department.  Pager Numbers  - Dr. Gwen Pounds: [phone]  - Dr. Roseanne Reno: [phone]  - Dr. Katrinka Blazing: [phone]   In the event of inclement weather, please call our main line at [phone] for an update on the status of any delays or closures.  Dermatology Medication Tips: Please keep the boxes that topical medications come in in order to help keep track of the instructions about where and how to use these. Pharmacies typically print the medication instructions only on the boxes and not directly on the medication tubes.   If your medication is too expensive, please contact our office at [phone] option 4 or send [place] a message through MyChart.   We are unable to tell what your co-pay for medications will be in advance as this is different depending on your insurance coverage. However, we may be able to find a substitute medication at lower cost or fill out paperwork to get insurance to cover a needed medication.   If a prior authorization is required to get your medication covered by your insurance company, please allow [place] 1-2 business days to complete this process.  Drug prices often vary depending on where the prescription is filled and some pharmacies may offer cheaper prices.  The website www.goodrx.com contains coupons for medications through different pharmacies. The prices here do not account for what the cost may be with help from insurance (it may be cheaper with your insurance), but  the website can give you the price if you did not use any insurance.  - You can print the associated coupon and take it with your prescription to the pharmacy.  - You may also stop by our office during regular business hours and pick up a GoodRx coupon card.  - If you need your prescription sent electronically to a different pharmacy, notify our office through [facility] or by phone at [phone] option 4.     Si Usted Necesita Algo Despus de Su  Visita  Tambin puede enviarnos un mensaje a travs de Clinical cytogeneticist. Por lo general respondemos a los mensajes de MyChart en el transcurso de 1 a 2 das hbiles.  Para renovar recetas, por favor pida a su farmacia que se ponga en contacto con nuestra oficina. Annie Sable de fax es [place] [phone].  Si tiene un asunto urgente cuando la clnica est cerrada y que no puede esperar hasta el siguiente da hbil, puede llamar/localizar a su doctor(a) al nmero que aparece a continuacin.   Por favor, tenga en cuenta que aunque hacemos todo lo posible para estar disponibles para asuntos urgentes fuera del horario de [place], no estamos disponibles las 24 horas del da, los 7 [address]  [address] de la [place].   Si tiene un problema urgente y no puede comunicarse con nosotros, puede optar por buscar atencin mdica  en el consultorio de su doctor(a), en una clnica privada, en un centro de atencin urgente o en una sala de emergencias.  Si tiene Engineer, drilling, por favor llame inmediatamente al 911 o vaya a la sala de emergencias.  Nmeros de bper  - Dr. Gwen Pounds: [phone]  - Dra. Roseanne Reno: [phone]  - Dr. Katrinka Blazing: [phone]   En caso de inclemencias del tiempo, por favor llame a Lacy Duverney principal al [phone] para una actualizacin sobre el [place] de cualquier retraso o cierre.  Consejos para la medicacin en dermatologa: Por favor, guarde las cajas en las que vienen los medicamentos de uso tpico para ayudarle a seguir las instrucciones sobre dnde y cmo usarlos. Las farmacias generalmente imprimen las instrucciones del medicamento slo en las cajas y no directamente en los tubos del [place].   Si su medicamento es muy caro, por favor, pngase en contacto con Rolm Gala llamando al [phone] y presione la opcin 4 o envenos un mensaje a travs de Clinical cytogeneticist.   No podemos decirle cul ser su copago por los medicamentos por adelantado ya que esto es diferente dependiendo de  la cobertura de su seguro. Sin embargo, es posible que podamos encontrar un medicamento sustituto a Audiological scientist un formulario para que el seguro cubra el medicamento que se considera necesario.   Si se requiere una autorizacin previa para que su compaa de seguros [place] su medicamento, por favor permtanos de 1 a 2 das hbiles para completar [address]  Los precios de los medicamentos varan con frecuencia dependiendo del Environmental consultant de dnde se surte la receta y alguna farmacias pueden ofrecer precios ms baratos.  El sitio web www.goodrx.com tiene cupones para medicamentos de Health and safety inspector. Los precios aqu no tienen en cuenta lo que podra costar con la ayuda del seguro (puede ser ms barato con su seguro), pero el sitio web puede darle el precio si no utiliz Tourist information centre manager.  - Puede imprimir el cupn correspondiente y llevarlo con su receta a la farmacia.  - Tambin puede pasar por nuestra oficina durante el horario de atencin regular y Education officer, museum una tarjeta de cupones  de GoodRx.  - Si necesita que su receta se enve electrnicamente a una farmacia diferente, informe a nuestra oficina a travs de MyChart de [place] o por telfono llamando al [phone] y presione la opcin 4.

## 2022-10-09 NOTE — Progress Notes (Unsigned)
Follow-Up Visit   Subjective  Maria Krueger is a 77 y.o. female who presents for the following: Skin Cancer Screening and Full Body Skin Exam  The patient presents for Total-Body Skin Exam (TBSE) for skin cancer screening and mole check. The patient has spots, moles and lesions to be evaluated, some may be new or changing and the patient may have concern these could be cancer.  The following portions of the chart were reviewed this encounter and updated as appropriate: medications, allergies, medical history  Review of Systems:  No other skin or systemic complaints except as noted in HPI or Assessment and Plan.  Objective  Well appearing patient in no apparent distress; mood and affect are within normal limits.  A full examination was performed including scalp, head, eyes, ears, nose, lips, neck, chest, axillae, abdomen, back, buttocks, bilateral upper extremities, bilateral lower extremities, hands, feet, fingers, toes, fingernails, and toenails. All findings within normal limits unless otherwise noted below.   Relevant physical exam findings are noted in the Assessment and Plan.    Assessment & Plan   SKIN CANCER SCREENING PERFORMED TODAY.  ACTINIC DAMAGE - Chronic condition, secondary to cumulative UV/sun exposure - diffuse scaly erythematous macules with underlying dyspigmentation - Recommend daily broad spectrum sunscreen SPF 30+ to sun-exposed areas, reapply every 2 hours as needed.  - Staying in the shade or wearing long sleeves, sun glasses (UVA+UVB protection) and wide brim hats (4-inch brim around the entire circumference of the hat) are also recommended for sun protection.  - Call for new or changing lesions.  LENTIGINES, SEBORRHEIC KERATOSES, HEMANGIOMAS - Benign normal skin lesions - Benign-appearing - Call for any changes  MELANOCYTIC NEVI - Tan-brown and/or pink-flesh-colored symmetric macules and papules - Benign appearing on exam today - Observation - Call  clinic for new or changing moles - Recommend daily use of broad spectrum spf 30+ sunscreen to sun-exposed areas.   HISTORY OF DYSPLASTIC NEVUS No evidence of recurrence today Recommend regular full body skin exams Recommend daily broad spectrum sunscreen SPF 30+ to sun-exposed areas, reapply every 2 hours as needed.  Call if any new or changing lesions are noted between office visits  HISTORY OF BASAL CELL CARCINOMA OF THE SKIN - No evidence of recurrence today - Recommend regular full body skin exams - Recommend daily broad spectrum sunscreen SPF 30+ to sun-exposed areas, reapply every 2 hours as needed.  - Call if any new or changing lesions are noted between office visits  ROSACEA Exam Mid face erythema with telangiectasias +/- scattered inflammatory papules  Chronic condition with duration or expected duration over one year. Currently well-controlled.  Rosacea is a chronic progressive skin condition usually affecting the face of adults, causing redness and/or acne bumps. It is treatable but not curable. It sometimes affects the eyes (ocular rosacea) as well. It may respond to topical and/or systemic medication and can flare with stress, sun exposure, alcohol, exercise, topical steroids (including hydrocortisone/cortisone 10) and some foods.  Daily application of broad spectrum spf 30+ sunscreen to face is recommended to reduce flares.  Treatment Plan Start rosacea mix QAM.   FACIAL ELASTOSIS Exam: Rhytides and volume loss.  Treatment Plan: Recommend daily broad spectrum sunscreen SPF 30+ to sun-exposed areas, reapply every 2 hours as needed. Call for new or changing lesions.  Staying in the shade or wearing long sleeves, sun glasses (UVA+UVB protection) and wide brim hats (4-inch brim around the entire circumference of the hat) are also recommended for sun protection.  Recommend Alastin body transform daily then applying CeraVe cream for the arms.   Recommend Alastin  restorative skin complex daily on the face. Skin cancer screening  Actinic skin damage  History of dysplastic nevus  History of basal cell carcinoma  Rosacea  Lentigo  Melanocytic nevus, unspecified location  Counseling and coordination of care  Medication management   Return in about 1 year (around 10/09/2023) for TBSE.  Maylene Roes, CMA, am acting as scribe for Armida Sans, MD .   Documentation: I have reviewed the above documentation for accuracy and completeness, and I agree with the above.  Armida Sans, MD

## 2022-10-11 ENCOUNTER — Encounter: Payer: Self-pay | Admitting: Dermatology

## 2023-03-16 ENCOUNTER — Other Ambulatory Visit: Payer: Self-pay | Admitting: Family Medicine

## 2023-03-16 DIAGNOSIS — Z1231 Encounter for screening mammogram for malignant neoplasm of breast: Secondary | ICD-10-CM

## 2023-04-30 ENCOUNTER — Ambulatory Visit
Admission: RE | Admit: 2023-04-30 | Discharge: 2023-04-30 | Disposition: A | Discharge status: Home / Self Care | Payer: Medicare HMO | Source: Ambulatory Visit | Attending: Family Medicine | Admitting: Family Medicine

## 2023-04-30 DIAGNOSIS — E785 Hyperlipidemia, unspecified: Secondary | ICD-10-CM | POA: Diagnosis not present

## 2023-04-30 DIAGNOSIS — Z0000 Encounter for general adult medical examination without abnormal findings: Secondary | ICD-10-CM | POA: Diagnosis not present

## 2023-04-30 DIAGNOSIS — Z1231 Encounter for screening mammogram for malignant neoplasm of breast: Secondary | ICD-10-CM | POA: Diagnosis not present

## 2023-05-07 DIAGNOSIS — M810 Age-related osteoporosis without current pathological fracture: Secondary | ICD-10-CM | POA: Diagnosis not present

## 2023-05-07 DIAGNOSIS — Z0000 Encounter for general adult medical examination without abnormal findings: Secondary | ICD-10-CM | POA: Diagnosis not present

## 2023-07-18 IMAGING — MG MM DIGITAL SCREENING BILAT W/ TOMO AND CAD
8 series · 8 of 24 positions shown · non-contrast
Comparison: Previous exam(s).

CLINICAL DATA: Screening.

EXAM:
DIGITAL SCREENING BILATERAL MAMMOGRAM WITH TOMOSYNTHESIS AND CAD
TECHNIQUE: Bilateral screening digital craniocaudal and mediolateral oblique
mammograms were obtained. Bilateral screening digital breast
tomosynthesis was performed. The images were evaluated with
computer-aided detection.

[L CC synth-2D]
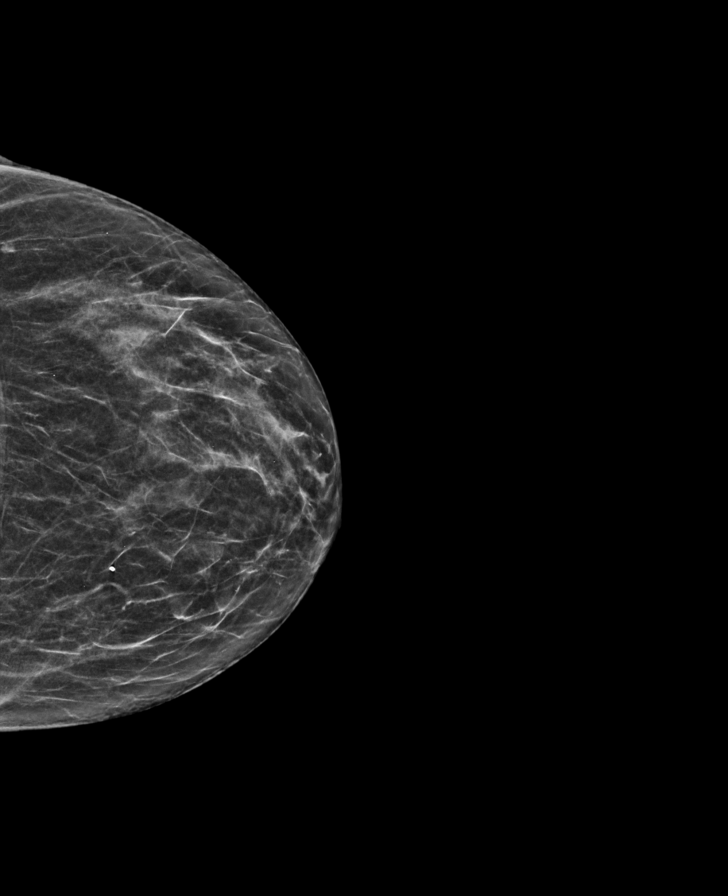

[R CC synth-2D]
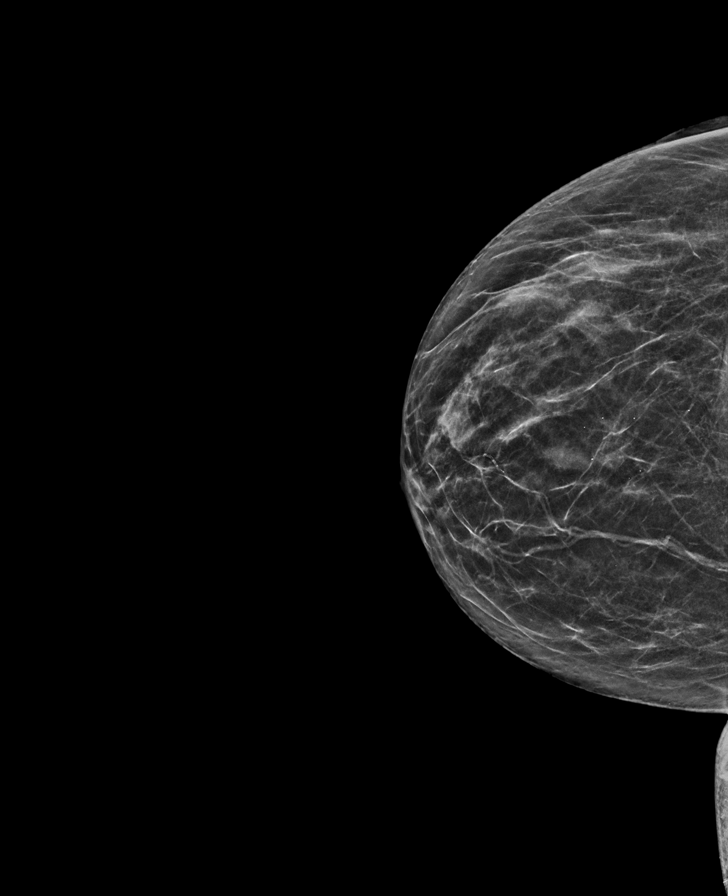

[R MLO synth-2D]
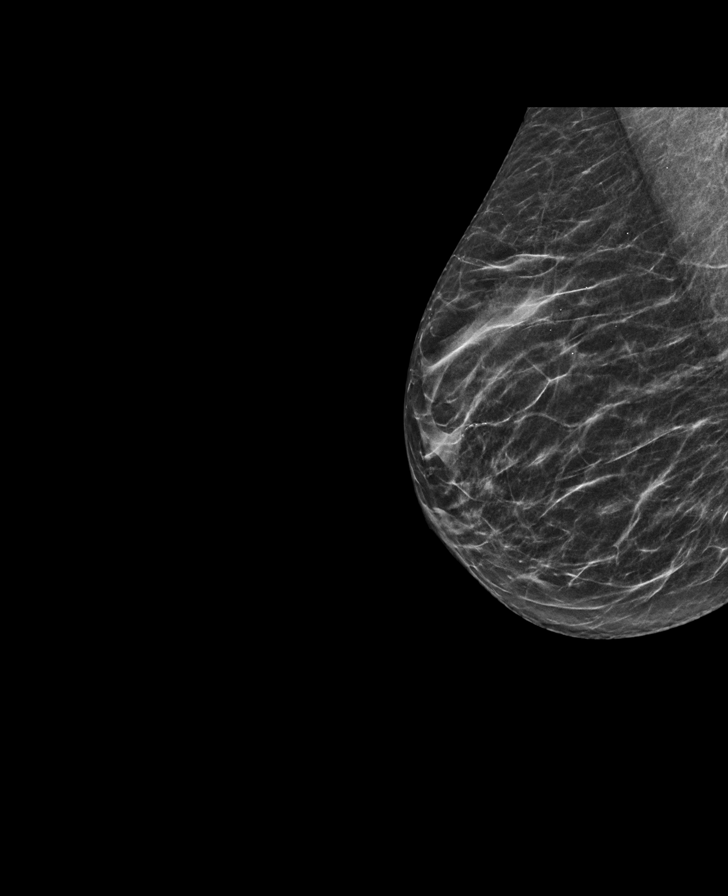

[L MLO synth-2D]
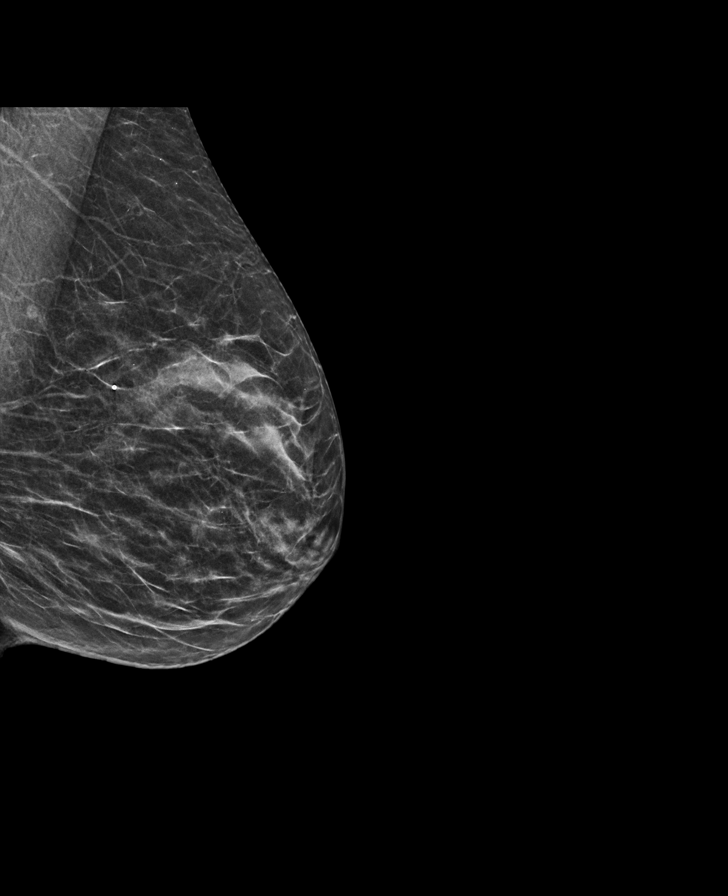

[L CC tomo · tomo slice 27/52.0]
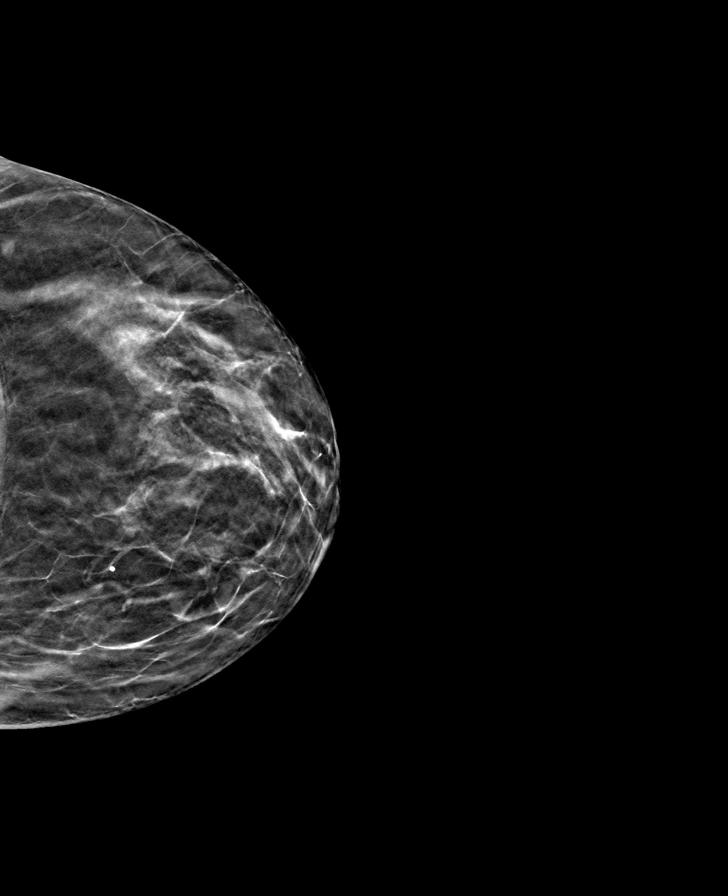

[R MLO tomo · tomo slice 26/51.0]
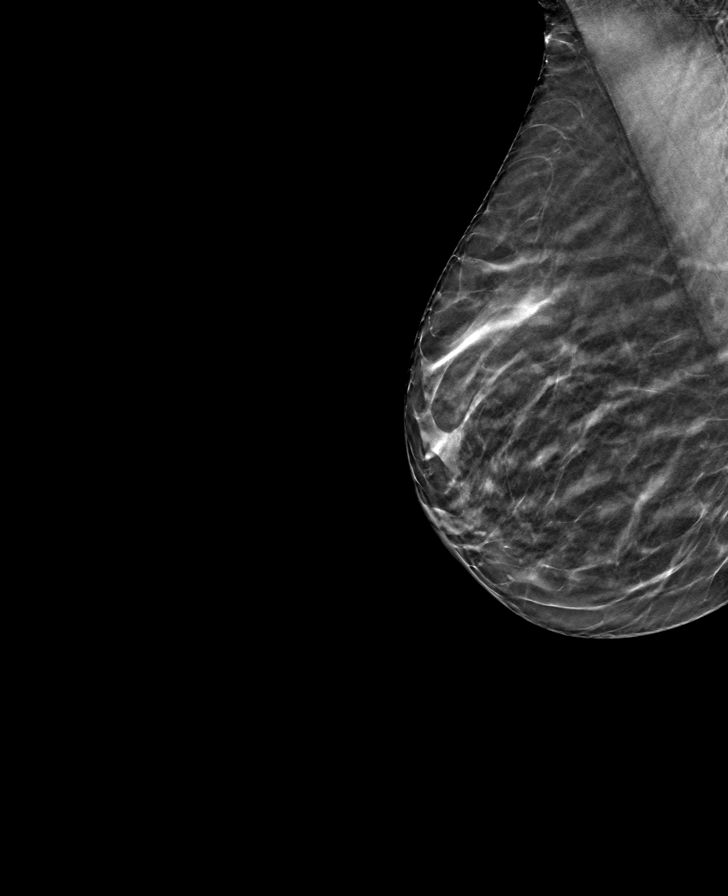

[R CC tomo · tomo slice 25/50.0]
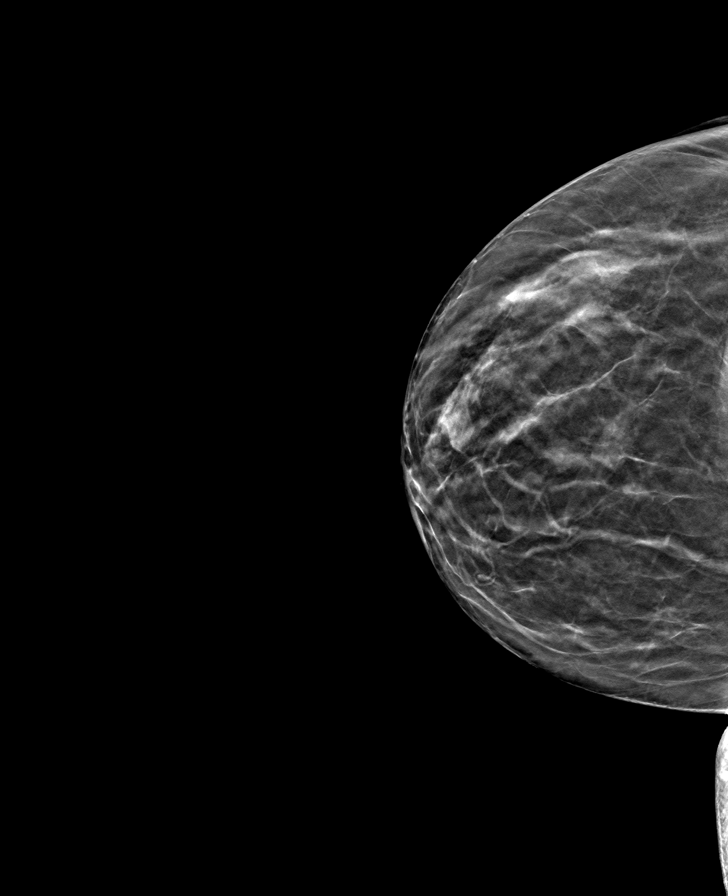

[L MLO tomo · tomo slice 26/51.0]
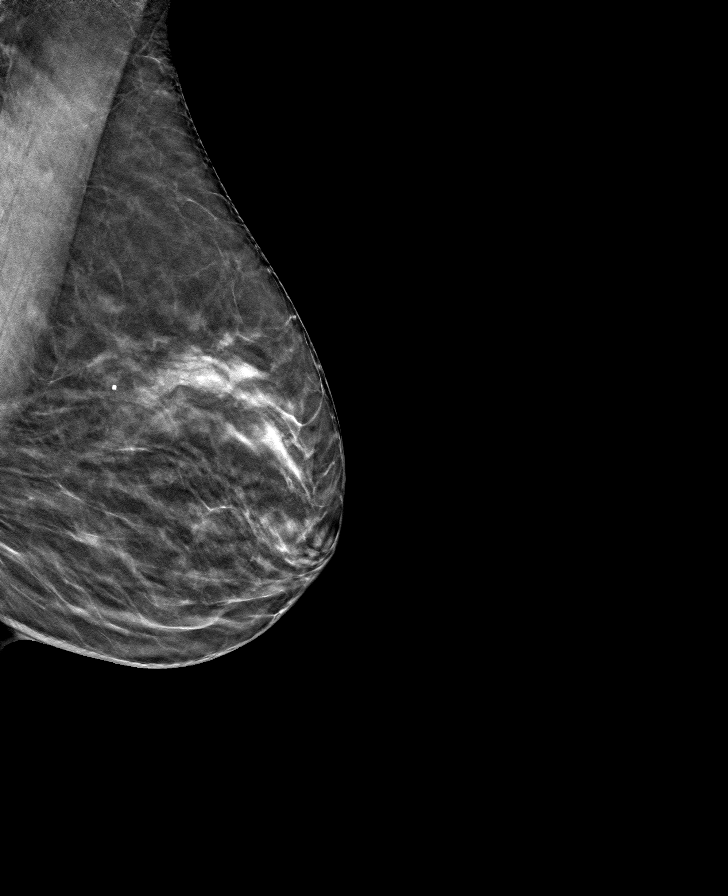

[8 of 24 positions shown; findings below may reference images not displayed]

ACR Breast Density Category b: There are scattered areas of
fibroglandular density.
FINDINGS: There are no findings suspicious for malignancy.
IMPRESSION: No mammographic evidence of malignancy. A result letter of this
screening mammogram will be mailed directly to the patient.

RECOMMENDATION:
Screening mammogram in one year. (Code:51-O-LD2)

BI-RADS CATEGORY  1: Negative.

## 2023-10-15 ENCOUNTER — Ambulatory Visit: Payer: Medicare HMO | Admitting: Dermatology

## 2023-10-21 DIAGNOSIS — M8589 Other specified disorders of bone density and structure, multiple sites: Secondary | ICD-10-CM | POA: Diagnosis not present

## 2023-11-03 ENCOUNTER — Ambulatory Visit: Admitting: Dermatology

## 2023-12-03 ENCOUNTER — Ambulatory Visit: Admitting: Dermatology

## 2024-01-12 ENCOUNTER — Encounter: Payer: Self-pay | Admitting: Dermatology

## 2024-01-12 ENCOUNTER — Ambulatory Visit: Admitting: Dermatology

## 2024-01-12 DIAGNOSIS — Z1283 Encounter for screening for malignant neoplasm of skin: Secondary | ICD-10-CM | POA: Diagnosis not present

## 2024-01-12 DIAGNOSIS — L820 Inflamed seborrheic keratosis: Secondary | ICD-10-CM

## 2024-01-12 DIAGNOSIS — L814 Other melanin hyperpigmentation: Secondary | ICD-10-CM | POA: Diagnosis not present

## 2024-01-12 DIAGNOSIS — Z85828 Personal history of other malignant neoplasm of skin: Secondary | ICD-10-CM | POA: Diagnosis not present

## 2024-01-12 DIAGNOSIS — L578 Other skin changes due to chronic exposure to nonionizing radiation: Secondary | ICD-10-CM | POA: Diagnosis not present

## 2024-01-12 DIAGNOSIS — L821 Other seborrheic keratosis: Secondary | ICD-10-CM | POA: Diagnosis not present

## 2024-01-12 DIAGNOSIS — W908XXA Exposure to other nonionizing radiation, initial encounter: Secondary | ICD-10-CM | POA: Diagnosis not present

## 2024-01-12 DIAGNOSIS — Z86018 Personal history of other benign neoplasm: Secondary | ICD-10-CM | POA: Diagnosis not present

## 2024-01-12 DIAGNOSIS — D1801 Hemangioma of skin and subcutaneous tissue: Secondary | ICD-10-CM | POA: Diagnosis not present

## 2024-01-12 DIAGNOSIS — L603 Nail dystrophy: Secondary | ICD-10-CM | POA: Diagnosis not present

## 2024-01-12 NOTE — Progress Notes (Unsigned)
 Follow-Up Visit   Subjective  Maria Krueger is a 78 y.o. female who presents for the following: Skin Cancer Screening and Full Body Skin Exam hx of BCC, Dysplastic Nevi, Aks,  check scaly spot crown scalp, comes and goes, check R great toenail, discoloration when she removes nail polish and then nail grows out to normal  The patient presents for Total-Body Skin Exam (TBSE) for skin cancer screening and mole check. The patient has spots, moles and lesions to be evaluated, some may be new or changing and the patient may have concern these could be cancer.    The following portions of the chart were reviewed this encounter and updated as appropriate: medications, allergies, medical history  Review of Systems:  No other skin or systemic complaints except as noted in HPI or Assessment and Plan.  Objective  Well appearing patient in no apparent distress; mood and affect are within normal limits.  A full examination was performed including scalp, head, eyes, ears, nose, lips, neck, chest, axillae, abdomen, back, buttocks, bilateral upper extremities, bilateral lower extremities, hands, feet, fingers, toes, fingernails, and toenails. All findings within normal limits unless otherwise noted below.   Relevant physical exam findings are noted in the Assessment and Plan.  scalp x 2, L upper arm x 1 (3) Stuck on waxy paps with erythema  Assessment & Plan   SKIN CANCER SCREENING PERFORMED TODAY.  ACTINIC DAMAGE - Chronic condition, secondary to cumulative UV/sun exposure - diffuse scaly erythematous macules with underlying dyspigmentation - Recommend daily broad spectrum sunscreen SPF 30+ to sun-exposed areas, reapply every 2 hours as needed.  - Staying in the shade or wearing long sleeves, sun glasses (UVA+UVB protection) and wide brim hats (4-inch brim around the entire circumference of the hat) are also recommended for sun protection.  - Call for new or changing lesions.  LENTIGINES,  SEBORRHEIC KERATOSES, HEMANGIOMAS - Benign normal skin lesions - Benign-appearing - Call for any changes  MELANOCYTIC NEVI - Tan-brown and/or pink-flesh-colored symmetric macules and papules - Benign appearing on exam today - Observation - Call clinic for new or changing moles - Recommend daily use of broad spectrum spf 30+ sunscreen to sun-exposed areas.   HISTORY OF BASAL CELL CARCINOMA OF THE SKIN - No evidence of recurrence today - Recommend regular full body skin exams - Recommend daily broad spectrum sunscreen SPF 30+ to sun-exposed areas, reapply every 2 hours as needed.  - Call if any new or changing lesions are noted between office visits  - Nasal tip  HISTORY OF DYSPLASTIC NEVUS No evidence of recurrence today Recommend regular full body skin exams Recommend daily broad spectrum sunscreen SPF 30+ to sun-exposed areas, reapply every 2 hours as needed.  Call if any new or changing lesions are noted between office visits  - L post lat thigh, L thigh, L thigh ant inferior  HISTORY OF PRECANCEROUS ACTINIC KERATOSIS - site(s) of PreCancerous Actinic Keratosis clear today. - these may recur and new lesions may form requiring treatment to prevent transformation into skin cancer - observe for new or changing spots and contact [facility] for appointment if occur - photoprotection with sun protective clothing; sunglasses and broad spectrum sunscreen with SPF of at least 30 + and frequent self skin exams recommended - yearly exams by a dermatologist recommended for persons with history of PreCancerous Actinic Keratoses   INFLAMED SEBORRHEIC KERATOSIS (3) scalp x 2, L upper arm x 1 (3) Symptomatic, irritating, patient would like treated. Destruction of  lesion - scalp x 2, L upper arm x 1 (3) Complexity: simple   Destruction method: cryotherapy   Informed consent: discussed and consent obtained   Timeout:  patient name, date of birth, surgical site, and procedure  verified Lesion destroyed using liquid nitrogen: Yes   Region frozen until ice ball extended beyond lesion: Yes   Outcome: patient tolerated procedure well with no complications   Post-procedure details: wound care instructions given     NAIL DYSTROPHY Mild R great toenail Exam: mild distal nail dystrophy R great toenail  Treatment Plan: Discussed if worsens may consider molecular nail culture  Return in about 1 year (around 01/11/2025) for TBSE, Hx of BCC, Hx of Dysplastic nevi, Hx of AKs.  I, Grayce Saunas, RMA, am acting as scribe for Alm Rhyme, MD .   Documentation: I have reviewed the above documentation for accuracy and completeness, and I agree with the above.  Alm Rhyme, MD

## 2024-01-12 NOTE — Patient Instructions (Signed)

## 2024-01-13 ENCOUNTER — Encounter: Payer: Self-pay | Admitting: Dermatology

## 2025-01-12 ENCOUNTER — Ambulatory Visit: Admitting: Dermatology
